# Patient Record
Sex: Female | Born: 1960 | Race: White | Hispanic: No | Smoking: Never smoker
Health system: Southern US, Community
[De-identification: ages and names within clinical notes are randomized; demographics above are authoritative.]

## PROBLEM LIST (undated history)

## (undated) DIAGNOSIS — T8859XA Other complications of anesthesia, initial encounter: Secondary | ICD-10-CM

## (undated) DIAGNOSIS — M19011 Primary osteoarthritis, right shoulder: Secondary | ICD-10-CM

## (undated) DIAGNOSIS — G4730 Sleep apnea, unspecified: Secondary | ICD-10-CM

## (undated) DIAGNOSIS — Z8619 Personal history of other infectious and parasitic diseases: Secondary | ICD-10-CM

## (undated) DIAGNOSIS — I10 Essential (primary) hypertension: Secondary | ICD-10-CM

## (undated) DIAGNOSIS — T4145XA Adverse effect of unspecified anesthetic, initial encounter: Secondary | ICD-10-CM

## (undated) DIAGNOSIS — M1990 Unspecified osteoarthritis, unspecified site: Secondary | ICD-10-CM

## (undated) DIAGNOSIS — I5030 Unspecified diastolic (congestive) heart failure: Secondary | ICD-10-CM

## (undated) DIAGNOSIS — R7303 Prediabetes: Secondary | ICD-10-CM

## (undated) DIAGNOSIS — D649 Anemia, unspecified: Secondary | ICD-10-CM

## (undated) DIAGNOSIS — K219 Gastro-esophageal reflux disease without esophagitis: Secondary | ICD-10-CM

## (undated) HISTORY — PX: APPENDECTOMY: SHX54

## (undated) HISTORY — PX: SINUS IRRIGATION: SHX2411

---

## 1997-03-03 DIAGNOSIS — G4730 Sleep apnea, unspecified: Secondary | ICD-10-CM

## 1997-03-03 HISTORY — DX: Sleep apnea, unspecified: G47.30

## 2011-10-02 ENCOUNTER — Emergency Department: Payer: Self-pay | Admitting: Emergency Medicine

## 2011-10-02 LAB — URINALYSIS, COMPLETE
Bilirubin,UR: NEGATIVE
Ketone: NEGATIVE
Leukocyte Esterase: NEGATIVE
RBC,UR: 4113 /HPF (ref 0–5)
Specific Gravity: 1.026 (ref 1.003–1.030)
Squamous Epithelial: NONE SEEN
WBC UR: NONE SEEN /HPF (ref 0–5)

## 2011-10-02 LAB — BASIC METABOLIC PANEL
Anion Gap: 8 (ref 7–16)
BUN: 18 mg/dL (ref 7–18)
Co2: 25 mmol/L (ref 21–32)
Creatinine: 0.82 mg/dL (ref 0.60–1.30)
EGFR (Non-African Amer.): >60
Glucose: 96 mg/dL (ref 65–99)
Osmolality: 283 (ref 275–301)
Potassium: 4.7 mmol/L (ref 3.5–5.1)
Sodium: 141 mmol/L (ref 136–145)

## 2011-10-02 LAB — CBC
HCT: 29 % — ABNORMAL LOW (ref 35.0–47.0)
MCV: 76 fL — ABNORMAL LOW (ref 80–100)
RBC: 3.83 10*6/uL (ref 3.80–5.20)
RDW: 19.3 % — ABNORMAL HIGH (ref 11.5–14.5)
WBC: 6.7 10*3/uL (ref 3.6–11.0)

## 2011-10-02 LAB — PREGNANCY, URINE: Pregnancy Test, Urine: NEGATIVE m[IU]/mL

## 2013-05-06 ENCOUNTER — Emergency Department: Payer: Self-pay | Admitting: Emergency Medicine

## 2013-05-06 LAB — COMPREHENSIVE METABOLIC PANEL
Albumin: 3.3 g/dL — ABNORMAL LOW (ref 3.4–5.0)
Alkaline Phosphatase: 91 U/L
Anion Gap: 6 — ABNORMAL LOW (ref 7–16)
BILIRUBIN TOTAL: 0.4 mg/dL (ref 0.2–1.0)
BUN: 10 mg/dL (ref 7–18)
CHLORIDE: 104 mmol/L (ref 98–107)
Calcium, Total: 8.7 mg/dL (ref 8.5–10.1)
Co2: 26 mmol/L (ref 21–32)
Creatinine: 0.73 mg/dL (ref 0.60–1.30)
EGFR (African American): >60
EGFR (Non-African Amer.): >60
GLUCOSE: 105 mg/dL — AB (ref 65–99)
OSMOLALITY: 271 (ref 275–301)
POTASSIUM: 3.5 mmol/L (ref 3.5–5.1)
SGOT(AST): 31 U/L (ref 15–37)
SGPT (ALT): 32 U/L (ref 12–78)
Sodium: 136 mmol/L (ref 136–145)
Total Protein: 7.1 g/dL (ref 6.4–8.2)

## 2013-05-06 LAB — CBC WITH DIFFERENTIAL/PLATELET
BASOS PCT: 0.6 %
Basophil #: 0.1 10*3/uL (ref 0.0–0.1)
EOS PCT: 2.2 %
Eosinophil #: 0.2 10*3/uL (ref 0.0–0.7)
HCT: 32.5 % — ABNORMAL LOW (ref 35.0–47.0)
HGB: 10.4 g/dL — AB (ref 12.0–16.0)
LYMPHS PCT: 20.8 %
Lymphocyte #: 1.6 10*3/uL (ref 1.0–3.6)
MCH: 26.1 pg (ref 26.0–34.0)
MCHC: 32.1 g/dL (ref 32.0–36.0)
MCV: 81 fL (ref 80–100)
MONOS PCT: 6.2 %
Monocyte #: 0.5 x10 3/mm (ref 0.2–0.9)
NEUTROS ABS: 5.5 10*3/uL (ref 1.4–6.5)
Neutrophil %: 70.2 %
PLATELETS: 278 10*3/uL (ref 150–440)
RBC: 3.99 10*6/uL (ref 3.80–5.20)
RDW: 17.1 % — ABNORMAL HIGH (ref 11.5–14.5)
WBC: 7.9 10*3/uL (ref 3.6–11.0)

## 2013-05-06 LAB — URINALYSIS, COMPLETE
BILIRUBIN, UR: NEGATIVE
Bacteria: NONE SEEN
Blood: NEGATIVE
GLUCOSE, UR: NEGATIVE mg/dL (ref 0–75)
Ketone: NEGATIVE
Nitrite: NEGATIVE
Ph: 8 (ref 4.5–8.0)
Protein: NEGATIVE
Specific Gravity: 1.011 (ref 1.003–1.030)
Squamous Epithelial: 4

## 2013-05-06 LAB — LIPASE, BLOOD: Lipase: 86 U/L (ref 73–393)

## 2013-12-12 ENCOUNTER — Emergency Department: Payer: Self-pay | Admitting: Emergency Medicine

## 2014-08-28 ENCOUNTER — Other Ambulatory Visit: Payer: Self-pay | Admitting: Orthopedic Surgery

## 2014-08-28 DIAGNOSIS — M25562 Pain in left knee: Secondary | ICD-10-CM

## 2014-09-06 ENCOUNTER — Ambulatory Visit
Admission: RE | Admit: 2014-09-06 | Discharge: 2014-09-06 | Disposition: A | Discharge status: Home / Self Care | Payer: No Typology Code available for payment source | Source: Ambulatory Visit | Attending: Orthopedic Surgery | Admitting: Orthopedic Surgery

## 2014-09-06 DIAGNOSIS — M25562 Pain in left knee: Secondary | ICD-10-CM | POA: Insufficient documentation

## 2015-01-31 ENCOUNTER — Emergency Department: Payer: No Typology Code available for payment source

## 2015-01-31 ENCOUNTER — Emergency Department
Admission: EM | Admit: 2015-01-31 | Discharge: 2015-01-31 | Disposition: A | Discharge status: Home / Self Care | Payer: No Typology Code available for payment source | Attending: Emergency Medicine | Admitting: Emergency Medicine

## 2015-01-31 DIAGNOSIS — R0789 Other chest pain: Secondary | ICD-10-CM

## 2015-01-31 DIAGNOSIS — S20211A Contusion of right front wall of thorax, initial encounter: Secondary | ICD-10-CM | POA: Insufficient documentation

## 2015-01-31 DIAGNOSIS — S29001A Unspecified injury of muscle and tendon of front wall of thorax, initial encounter: Secondary | ICD-10-CM | POA: Diagnosis present

## 2015-01-31 DIAGNOSIS — I10 Essential (primary) hypertension: Secondary | ICD-10-CM | POA: Diagnosis not present

## 2015-01-31 DIAGNOSIS — Y9389 Activity, other specified: Secondary | ICD-10-CM | POA: Diagnosis not present

## 2015-01-31 DIAGNOSIS — S199XXA Unspecified injury of neck, initial encounter: Secondary | ICD-10-CM | POA: Insufficient documentation

## 2015-01-31 DIAGNOSIS — Y998 Other external cause status: Secondary | ICD-10-CM | POA: Diagnosis not present

## 2015-01-31 DIAGNOSIS — S39012A Strain of muscle, fascia and tendon of lower back, initial encounter: Secondary | ICD-10-CM | POA: Insufficient documentation

## 2015-01-31 DIAGNOSIS — Y92410 Unspecified street and highway as the place of occurrence of the external cause: Secondary | ICD-10-CM | POA: Insufficient documentation

## 2015-01-31 HISTORY — DX: Essential (primary) hypertension: I10

## 2015-01-31 MED ORDER — KETOROLAC TROMETHAMINE 30 MG/ML IJ SOLN
60.0000 mg | Freq: Once | INTRAMUSCULAR | Status: AC
  Administered 2015-01-31: 60 mg via INTRAMUSCULAR

## 2015-01-31 MED ORDER — TRAMADOL HCL 50 MG PO TABS: 50.0000 mg | ORAL_TABLET | Freq: Four times a day (QID) | ORAL | Status: DC | PRN

## 2015-01-31 NOTE — Discharge Instructions (Signed)
Chest Wall Pain Chest wall pain is pain in or around the bones and muscles of your chest. Sometimes, an injury causes this pain. Sometimes, the cause may not be known. This pain may take several weeks or longer to get better. HOME CARE INSTRUCTIONS  Pay attention to any changes in your symptoms. Take these actions to help with your pain:   Rest as told by your health care provider.   Avoid activities that cause pain. These include any activities that use your chest muscles or your abdominal and side muscles to lift heavy items.   If directed, apply ice to the painful area:  Put ice in a plastic bag.  Place a towel between your skin and the bag.  Leave the ice on for 20 minutes, 2-3 times per day.  Take over-the-counter and prescription medicines only as told by your health care provider.  Do not use tobacco products, including cigarettes, chewing tobacco, and e-cigarettes. If you need help quitting, ask your health care provider.  Keep all follow-up visits as told by your health care provider. This is important. SEEK MEDICAL CARE IF:  You have a fever.  Your chest pain becomes worse.  You have new symptoms. SEEK IMMEDIATE MEDICAL CARE IF:  You have nausea or vomiting.  You feel sweaty or light-headed.  You have a cough with phlegm (sputum) or you cough up blood.  You develop shortness of breath.   This information is not intended to replace advice given to you by your health care provider. Make sure you discuss any questions you have with your health care provider.   Document Released: 02/17/2005 Document Revised: 11/08/2014 Document Reviewed: 05/15/2014 Elsevier Interactive Patient Education [employer]2016 [employer].  Please return immediately if condition worsens. Please contact her primary physician or the physician you were given for referral. If you have any specialist physicians involved in her treatment and plan please also contact them. Thank you for using [place]  [facility]. Return especially for increasing chest, abdominal pain, or any other new concerns
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
## 2015-04-26 ENCOUNTER — Other Ambulatory Visit: Payer: Self-pay | Admitting: Family Medicine

## 2015-04-26 DIAGNOSIS — M25511 Pain in right shoulder: Secondary | ICD-10-CM

## 2015-05-17 ENCOUNTER — Ambulatory Visit
Admission: RE | Admit: 2015-05-17 | Discharge: 2015-05-17 | Disposition: A | Discharge status: Home / Self Care | Payer: BLUE CROSS/BLUE SHIELD | Source: Ambulatory Visit | Attending: Family Medicine | Admitting: Family Medicine

## 2015-05-17 DIAGNOSIS — M67813 Other specified disorders of tendon, right shoulder: Secondary | ICD-10-CM | POA: Diagnosis not present

## 2015-05-17 DIAGNOSIS — M75101 Unspecified rotator cuff tear or rupture of right shoulder, not specified as traumatic: Secondary | ICD-10-CM | POA: Insufficient documentation

## 2015-05-17 DIAGNOSIS — T1490 Injury, unspecified: Secondary | ICD-10-CM | POA: Diagnosis present

## 2015-05-17 DIAGNOSIS — M25511 Pain in right shoulder: Secondary | ICD-10-CM | POA: Diagnosis present

## 2015-05-30 ENCOUNTER — Other Ambulatory Visit: Payer: BLUE CROSS/BLUE SHIELD

## 2015-05-30 ENCOUNTER — Encounter: Payer: Self-pay | Admitting: *Deleted

## 2015-05-30 NOTE — Patient Instructions (Signed)
  Your procedure is scheduled on: 06-05-15 Report to [facility] 2ND FLOOR. To find out your arrival time please call [phone] between 1PM - 3PM on 06-04-15  Remember: Instructions that are not followed completely may result in serious medical risk, up to and including death, or upon the discretion of your surgeon and anesthesiologist your surgery may need to be rescheduled.    _X___ 1. Do not eat food or drink liquids after midnight. No gum chewing or hard candies.     _X___ 2. No Alcohol for 24 hours before or after surgery.   ____ 3. Bring all medications with you on the day of surgery if instructed.    _X___ 4. Notify your doctor if there is any change in your medical condition     (cold, fever, infections).     Do not wear jewelry, make-up, hairpins, clips or nail polish.  Do not wear lotions, powders, or perfumes. You may wear deodorant.  Do not shave 48 hours prior to surgery. Men may shave face and neck.  Do not bring valuables to the hospital.    [facility] belongings or valuables.               Contacts, dentures or bridgework may not be worn into surgery.  Leave your suitcase in the car. After surgery it may be brought to your room.  For patients admitted to the hospital, discharge time is determined by your  treatment team.   Patients discharged the day of surgery will not be allowed to drive home.   Please read over the following fact sheets that you were given:    _X___ Take these medicines the morning of surgery with A SIP OF WATER:    1. PRILOSEC  2.  TAKE A PRILOSEC Monday NIGHT(06-04-15) BEFORE BED  3.   4.  5.  6.  ____ Fleet Enema (as directed)   ____ Use CHG Soap as directed  ____ Use inhalers on the day of surgery  ____ Stop metformin 2 days prior to surgery    ____ Take 1/2 of usual insulin dose the night before surgery and none on the morning of surgery.   ____ Stop  Coumadin/Plavix/aspirin-N/A  ____ Stop Anti-inflammatories-NO NSAIDS OR ASA PRODUCTS-TYLENOL OK TO TAKE   ____ Stop supplements until after surgery.    ____ Bring C-Pap to the hospital.

## 2015-06-04 ENCOUNTER — Encounter: Payer: Self-pay | Admitting: *Deleted

## 2015-06-05 ENCOUNTER — Ambulatory Visit: Payer: BLUE CROSS/BLUE SHIELD | Admitting: Certified Registered"

## 2015-06-05 ENCOUNTER — Ambulatory Visit
Admission: RE | Admit: 2015-06-05 | Discharge: 2015-06-05 | Disposition: A | Discharge status: Home / Self Care | Payer: BLUE CROSS/BLUE SHIELD | Source: Ambulatory Visit | Attending: Surgery | Admitting: Surgery

## 2015-06-05 ENCOUNTER — Encounter: Payer: Self-pay | Admitting: *Deleted

## 2015-06-05 ENCOUNTER — Encounter
Admission: RE | Disposition: A | Discharge status: Home / Self Care | Payer: Self-pay | Source: Ambulatory Visit | Attending: Surgery

## 2015-06-05 DIAGNOSIS — Z791 Long term (current) use of non-steroidal anti-inflammatories (NSAID): Secondary | ICD-10-CM | POA: Diagnosis not present

## 2015-06-05 DIAGNOSIS — Z8249 Family history of ischemic heart disease and other diseases of the circulatory system: Secondary | ICD-10-CM | POA: Diagnosis not present

## 2015-06-05 DIAGNOSIS — X58XXXA Exposure to other specified factors, initial encounter: Secondary | ICD-10-CM | POA: Diagnosis not present

## 2015-06-05 DIAGNOSIS — Z79899 Other long term (current) drug therapy: Secondary | ICD-10-CM | POA: Diagnosis not present

## 2015-06-05 DIAGNOSIS — S43431A Superior glenoid labrum lesion of right shoulder, initial encounter: Secondary | ICD-10-CM | POA: Diagnosis not present

## 2015-06-05 DIAGNOSIS — Z9049 Acquired absence of other specified parts of digestive tract: Secondary | ICD-10-CM | POA: Diagnosis not present

## 2015-06-05 DIAGNOSIS — M75111 Incomplete rotator cuff tear or rupture of right shoulder, not specified as traumatic: Secondary | ICD-10-CM | POA: Insufficient documentation

## 2015-06-05 DIAGNOSIS — K219 Gastro-esophageal reflux disease without esophagitis: Secondary | ICD-10-CM | POA: Insufficient documentation

## 2015-06-05 DIAGNOSIS — Z808 Family history of malignant neoplasm of other organs or systems: Secondary | ICD-10-CM | POA: Diagnosis not present

## 2015-06-05 DIAGNOSIS — I10 Essential (primary) hypertension: Secondary | ICD-10-CM | POA: Insufficient documentation

## 2015-06-05 DIAGNOSIS — M19011 Primary osteoarthritis, right shoulder: Secondary | ICD-10-CM | POA: Diagnosis not present

## 2015-06-05 HISTORY — DX: Unspecified osteoarthritis, unspecified site: M19.90

## 2015-06-05 HISTORY — PX: SHOULDER ARTHROSCOPY: SHX128

## 2015-06-05 HISTORY — DX: Gastro-esophageal reflux disease without esophagitis: K21.9

## 2015-06-05 SURGERY — ARTHROSCOPY, SHOULDER
Anesthesia: General | Laterality: Right | Wound class: Clean

## 2015-06-05 MED ORDER — NEOSTIGMINE METHYLSULFATE 10 MG/10ML IV SOLN
INTRAVENOUS | Status: DC | PRN
  Administered 2015-06-05: 5 mg via INTRAVENOUS

## 2015-06-05 MED ORDER — FENTANYL CITRATE (PF) 100 MCG/2ML IJ SOLN
INTRAMUSCULAR | Status: DC | PRN
  Administered 2015-06-05 (×3): 50 ug via INTRAVENOUS
  Administered 2015-06-05 (×2): 25 ug via INTRAVENOUS

## 2015-06-05 MED ORDER — ROPIVACAINE HCL 2 MG/ML IJ SOLN
INTRAMUSCULAR | Status: AC
  Filled 2015-06-05: qty 20

## 2015-06-05 MED ORDER — FENTANYL CITRATE (PF) 100 MCG/2ML IJ SOLN
INTRAMUSCULAR | Status: AC
  Administered 2015-06-05: 25 ug via INTRAVENOUS
  Filled 2015-06-05: qty 2

## 2015-06-05 MED ORDER — MIDAZOLAM HCL 5 MG/5ML IJ SOLN
INTRAMUSCULAR | Status: AC
  Administered 2015-06-05: 1 mg
  Filled 2015-06-05: qty 5

## 2015-06-05 MED ORDER — DEXAMETHASONE SODIUM PHOSPHATE 10 MG/ML IJ SOLN
INTRAMUSCULAR | Status: DC | PRN
  Administered 2015-06-05: 4 mg via INTRAVENOUS

## 2015-06-05 MED ORDER — PROPOFOL 10 MG/ML IV BOLUS
INTRAVENOUS | Status: DC | PRN
  Administered 2015-06-05: 150 mg via INTRAVENOUS

## 2015-06-05 MED ORDER — LACTATED RINGERS IV SOLN
INTRAVENOUS | Status: DC
  Administered 2015-06-05 (×2): via INTRAVENOUS

## 2015-06-05 MED ORDER — FENTANYL CITRATE (PF) 100 MCG/2ML IJ SOLN: 25.0000 ug | INTRAMUSCULAR | Status: DC | PRN

## 2015-06-05 MED ORDER — EPINEPHRINE HCL 1 MG/ML IJ SOLN
INTRAMUSCULAR | Status: AC
  Filled 2015-06-05: qty 2

## 2015-06-05 MED ORDER — OXYCODONE HCL 5 MG PO TABS: 5.0000 mg | ORAL_TABLET | ORAL | Status: DC | PRN

## 2015-06-05 MED ORDER — CEFAZOLIN SODIUM-DEXTROSE 2-4 GM/100ML-% IV SOLN
INTRAVENOUS | Status: AC
  Filled 2015-06-05: qty 100

## 2015-06-05 MED ORDER — EPINEPHRINE HCL 1 MG/ML IJ SOLN
INTRAMUSCULAR | Status: AC
  Filled 2015-06-05: qty 1

## 2015-06-05 MED ORDER — ONDANSETRON HCL 4 MG/2ML IJ SOLN: 4.0000 mg | Freq: Once | INTRAMUSCULAR | Status: DC | PRN

## 2015-06-05 MED ORDER — MIDAZOLAM HCL 2 MG/2ML IJ SOLN
INTRAMUSCULAR | Status: DC | PRN
  Administered 2015-06-05 (×2): 2 mg via INTRAVENOUS

## 2015-06-05 MED ORDER — FENTANYL CITRATE (PF) 100 MCG/2ML IJ SOLN
50.0000 ug | Freq: Once | INTRAMUSCULAR | Status: AC
  Administered 2015-06-05: 50 ug via INTRAVENOUS

## 2015-06-05 MED ORDER — ROCURONIUM BROMIDE 100 MG/10ML IV SOLN
INTRAVENOUS | Status: DC | PRN
  Administered 2015-06-05: 30 mg via INTRAVENOUS
  Administered 2015-06-05 (×3): 5 mg via INTRAVENOUS

## 2015-06-05 MED ORDER — FENTANYL CITRATE (PF) 100 MCG/2ML IJ SOLN
25.0000 ug | INTRAMUSCULAR | Status: AC | PRN
  Administered 2015-06-05 (×6): 25 ug via INTRAVENOUS

## 2015-06-05 MED ORDER — MIDAZOLAM HCL 5 MG/5ML IJ SOLN
1.0000 mg | Freq: Once | INTRAMUSCULAR | Status: AC
  Administered 2015-06-05: 1 mg via INTRAVENOUS

## 2015-06-05 MED ORDER — EPINEPHRINE HCL 1 MG/ML IJ SOLN
INTRAMUSCULAR | Status: DC
Start: 2015-06-05 — End: 2015-06-05
  Filled 2015-06-05: qty 1

## 2015-06-05 MED ORDER — CEFAZOLIN SODIUM-DEXTROSE 2-4 GM/100ML-% IV SOLN
2.0000 g | Freq: Once | INTRAVENOUS | Status: AC
  Administered 2015-06-05: 2 g via INTRAVENOUS

## 2015-06-05 MED ORDER — FENTANYL CITRATE (PF) 100 MCG/2ML IJ SOLN
INTRAMUSCULAR | Status: AC
  Filled 2015-06-05: qty 2

## 2015-06-05 MED ORDER — ONDANSETRON HCL 4 MG/2ML IJ SOLN
INTRAMUSCULAR | Status: DC | PRN
  Administered 2015-06-05: 4 mg via INTRAVENOUS

## 2015-06-05 MED ORDER — BUPIVACAINE-EPINEPHRINE (PF) 0.5% -1:200000 IJ SOLN
INTRAMUSCULAR | Status: AC
  Filled 2015-06-05: qty 30

## 2015-06-05 MED ORDER — BUPIVACAINE-EPINEPHRINE (PF) 0.5% -1:200000 IJ SOLN
INTRAMUSCULAR | Status: DC | PRN
  Administered 2015-06-05: 30 mL

## 2015-06-05 MED ORDER — FAMOTIDINE 20 MG PO TABS
ORAL_TABLET | ORAL | Status: AC
  Filled 2015-06-05: qty 1

## 2015-06-05 MED ORDER — LIDOCAINE HCL (CARDIAC) 20 MG/ML IV SOLN
INTRAVENOUS | Status: DC | PRN
  Administered 2015-06-05: 40 mg via INTRAVENOUS

## 2015-06-05 MED ORDER — LIDOCAINE HCL (PF) 1 % IJ SOLN
INTRAMUSCULAR | Status: AC
  Filled 2015-06-05: qty 5

## 2015-06-05 MED ORDER — GLYCOPYRROLATE 0.2 MG/ML IJ SOLN
INTRAMUSCULAR | Status: DC | PRN
  Administered 2015-06-05: .8 mg via INTRAVENOUS

## 2015-06-05 MED ORDER — EPINEPHRINE HCL 1 MG/ML IJ SOLN
INTRAMUSCULAR | Status: DC | PRN
  Administered 2015-06-05: 3 mL

## 2015-06-05 MED ORDER — SUCCINYLCHOLINE CHLORIDE 20 MG/ML IJ SOLN
INTRAMUSCULAR | Status: DC | PRN
  Administered 2015-06-05: 140 mg via INTRAVENOUS

## 2015-06-05 MED ORDER — FENTANYL CITRATE (PF) 100 MCG/2ML IJ SOLN
INTRAMUSCULAR | Status: AC
  Administered 2015-06-05: 50 ug
  Filled 2015-06-05: qty 2

## 2015-06-05 SURGICAL SUPPLY — 58 items
ANCHOR JUGGERKNOT WTAP NDL 2.9 (Anchor) ×2 IMPLANT
ANCHOR SUT QUATTRO KNTLS 4.5 (Anchor) ×2 IMPLANT
ANCHOR SUT W/ ORTHOCORD (Anchor) ×2 IMPLANT
BIT DRILL JUGRKNT W/NDL BIT2.9 (DRILL) ×1 IMPLANT
BLADE FULL RADIUS 3.5 (BLADE) IMPLANT
BLADE SHAVER 4.5X7 STR FR (MISCELLANEOUS) ×2 IMPLANT
BUR ACROMIONIZER 4.0 (BURR) ×2 IMPLANT
BUR BR 5.5 WIDE MOUTH (BURR) IMPLANT
CANNULA 8.5X75 THRED (CANNULA) ×2 IMPLANT
CANNULA SHAVER 8MMX76MM (CANNULA) ×4 IMPLANT
CANNULA TWIST IN 8.25X9CM (CANNULA) ×2 IMPLANT
CHLORAPREP W/TINT 26ML (MISCELLANEOUS) ×4 IMPLANT
DRAPE IMP U-DRAPE 54X76 (DRAPES) ×4 IMPLANT
DRAPE SURG 17X11 SM STRL (DRAPES) ×2 IMPLANT
DRILL JUGGERKNOT W/NDL BIT 2.9 (DRILL) ×2
DRSG OPSITE POSTOP 4X8 (GAUZE/BANDAGES/DRESSINGS) ×2 IMPLANT
ELECT REM PT RETURN 9FT ADLT (ELECTROSURGICAL) ×2
ELECTRODE REM PT RTRN 9FT ADLT (ELECTROSURGICAL) ×1 IMPLANT
GAUZE PETRO XEROFOAM 1X8 (MISCELLANEOUS) ×2 IMPLANT
GAUZE SPONGE 4X4 12PLY STRL (GAUZE/BANDAGES/DRESSINGS) ×2 IMPLANT
GLOVE BIO SURGEON STRL SZ7.5 (GLOVE) ×12 IMPLANT
GLOVE BIO SURGEON STRL SZ8 (GLOVE) ×4 IMPLANT
GLOVE BIOGEL PI IND STRL 8 (GLOVE) ×6 IMPLANT
GLOVE BIOGEL PI INDICATOR 8 (GLOVE) ×6
GLOVE INDICATOR 8.0 STRL GRN (GLOVE) ×2 IMPLANT
GOWN STRL REUS W/ TWL LRG LVL3 (GOWN DISPOSABLE) ×2 IMPLANT
GOWN STRL REUS W/ TWL XL LVL3 (GOWN DISPOSABLE) ×1 IMPLANT
GOWN STRL REUS W/TWL LRG LVL3 (GOWN DISPOSABLE) ×2
GOWN STRL REUS W/TWL XL LVL3 (GOWN DISPOSABLE) ×1
GRASPER SUT 15 45D LOW PRO (SUTURE) ×2 IMPLANT
IV LACTATED RINGER IRRG 3000ML (IV SOLUTION) ×3
IV LR IRRIG 3000ML ARTHROMATIC (IV SOLUTION) ×3 IMPLANT
MANIFOLD NEPTUNE II (INSTRUMENTS) ×2 IMPLANT
MASK FACE SPIDER DISP (MASK) ×2 IMPLANT
MAT BLUE FLOOR 46X72 FLO (MISCELLANEOUS) ×2 IMPLANT
NDL MAYO CATGUT SZ4 (NEEDLE) ×2 IMPLANT
NEEDLE HYPO 22GX1.5 SAFETY (NEEDLE) ×2 IMPLANT
NEEDLE MAYO 6 CRC TAPER PT (NEEDLE) ×2 IMPLANT
NEEDLE MAYO CATGUT SZ 1.5 (NEEDLE) ×2
NEEDLE MAYO CATGUT SZ 2 (NEEDLE) ×1 IMPLANT
NEEDLE REVERSE CUT 1/2 CRC (NEEDLE) ×2 IMPLANT
NEEDLE SPNL 18GX3.5 QUINCKE PK (NEEDLE) ×2 IMPLANT
PACK ARTHROSCOPY SHOULDER (MISCELLANEOUS) ×2 IMPLANT
SLING ARM LRG DEEP (SOFTGOODS) IMPLANT
SLING ULTRA II LG (MISCELLANEOUS) ×2 IMPLANT
STAPLER SKIN PROX 35W (STAPLE) ×2 IMPLANT
STRAP SAFETY BODY (MISCELLANEOUS) ×2 IMPLANT
SUT ETHIBOND 0 MO6 C/R (SUTURE) ×2 IMPLANT
SUT PROLENE 4 0 PS 2 18 (SUTURE) ×2 IMPLANT
SUT VIC AB 0 CT1 36 (SUTURE) ×2 IMPLANT
SUT VIC AB 0 CT2 27 (SUTURE) ×2 IMPLANT
SUT VIC AB 2-0 CT1 27 (SUTURE) ×2
SUT VIC AB 2-0 CT1 TAPERPNT 27 (SUTURE) ×2 IMPLANT
SUT VIC AB 2-0 CT2 27 (SUTURE) ×4 IMPLANT
TAPE MICROFOAM 4IN (TAPE) ×2 IMPLANT
TUBING ARTHRO INFLOW-ONLY STRL (TUBING) ×2 IMPLANT
TUBING CONNECTING 10 (TUBING) ×2 IMPLANT
WAND HAND CNTRL MULTIVAC 90 (MISCELLANEOUS) ×2 IMPLANT

## 2015-06-05 NOTE — Anesthesia Preprocedure Evaluation (Signed)
Anesthesia Evaluation  Patient identified by MRN, date of birth, ID band Patient awake    Reviewed: Allergy & Precautions, H&P , NPO status , Patient's Chart, lab work & pertinent test results, reviewed documented beta blocker date and time   Airway Mallampati: III  TM Distance: >3 FB Neck ROM: full    Dental  (+) Teeth Intact   Pulmonary neg pulmonary ROS,    Pulmonary exam normal        Cardiovascular Exercise Tolerance: Good hypertension, negative cardio ROS Normal cardiovascular exam Rhythm:regular Rate:Normal     Neuro/Psych negative neurological ROS  negative psych ROS   GI/Hepatic negative GI ROS, Neg liver ROS, GERD  ,  Endo/Other  negative endocrine ROS  Renal/GU negative Renal ROS  negative genitourinary   Musculoskeletal   Abdominal   Peds  Hematology negative hematology ROS (+)   Anesthesia Other Findings Past Medical History:   GERD (gastroesophageal reflux disease)                       Arthritis                                                    Hypertension                                                   Comment:H/O OVER A SHORT PERIOD OF TIME- LOST WEIGHT               AND IS NO LONGER TAKING BP MED PER PCP Past Surgical History:   APPENDECTOMY                                                  SINUS IRRIGATION                                            BMI    Body Mass Index   42.52 kg/m 2     Reproductive/Obstetrics negative OB ROS                             Anesthesia Physical Anesthesia Plan  ASA: II  Anesthesia Plan: General ETT   Post-op Pain Management:    Induction:   Airway Management Planned:   Additional Equipment:   Intra-op Plan:   Post-operative Plan:   Informed Consent: I have reviewed the patients History and Physical, chart, labs and discussed the procedure including the risks, benefits and alternatives for the proposed anesthesia  with the patient or authorized representative who has indicated his/her understanding and acceptance.   Dental Advisory Given  Plan Discussed with: CRNA  Anesthesia Plan Comments:         Anesthesia Quick Evaluation

## 2015-06-05 NOTE — Anesthesia Postprocedure Evaluation (Signed)
Anesthesia Post Note  Patient: Sydney MccoyJeannie M Butzin  Procedure(s) Performed: Procedure(s) (LRB): ARTHROSCOPY SHOULDER (Right)  Patient location during evaluation: PACU Anesthesia Type: General Level of consciousness: awake and alert Pain management: pain level controlled Vital Signs Assessment: post-procedure vital signs reviewed and stable Respiratory status: spontaneous breathing, nonlabored ventilation, respiratory function stable and patient connected to nasal cannula oxygen Cardiovascular status: blood pressure returned to baseline and stable Postop Assessment: no signs of nausea or vomiting Anesthetic complications: no    Last Vitals:  Filed Vitals:   06/05/15 1155 06/05/15 1210  BP: 158/69 157/86  Pulse: 71 67  Temp: 36.2 C   Resp: 13 18    Last Pain:  Filed Vitals:   06/05/15 1212  PainSc: 8                  Yevette EdwardsJames G Adams

## 2015-06-05 NOTE — Discharge Instructions (Addendum)
Keep dressing dry and intact.  May shower after dressing changed on post-op day #4 (Saturday).  Cover staples with Band-Aids after drying off. Apply ice frequently to shoulder. Take ibuprofen 800 mg three times daily with meals for 10-14 days, then as needed. Take pain medication as ordered when needed. May supplement these medications with ES Tylenol if necessary. Keep shoulder immobilizer on at all times except may remove for bathing purposes. Follow-up in 10-14 days or as scheduled.  AMBULATORY SURGERY  DISCHARGE INSTRUCTIONS   1) The drugs that you were given will stay in your system until tomorrow so for the next 24 hours you should not:  A) Drive an automobile B) Make any legal decisions C) Drink any alcoholic beverage   2) You may resume regular meals tomorrow.  Today it is better to start with liquids and gradually work up to solid foods.  You may eat anything you prefer, but it is better to start with liquids, then soup and crackers, and gradually work up to solid foods.   3) Please notify your doctor immediately if you have any unusual bleeding, trouble breathing, redness and pain at the surgery site, drainage, fever, or pain not relieved by medication.    4) Additional Instructions:        Please contact your physician with any problems or Same Day Surgery at [phone], Monday through Friday 6 am to 4 pm, or [place] [facility] Main number at [phone].
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
## 2015-06-05 NOTE — Anesthesia Procedure Notes (Addendum)
Anesthesia Regional Block:  Interscalene brachial plexus block  Pre-Anesthetic Checklist: ,, timeout performed, Correct Patient, Correct Site, Correct Laterality, Correct Procedure, Correct Position, site marked, Risks and benefits discussed,  Surgical consent,  Pre-op evaluation,  At surgeon's request and post-op pain management  Laterality: Left  Prep: chloraprep       Needles:  Injection technique: Single-shot  Needle Type: Echogenic Stimulator Needle     Needle Length: 5cm 5 cm Needle Gauge: 21 and 21 G    Additional Needles:  Procedures: Doppler guided, ultrasound guided (picture in chart) and nerve stimulator Interscalene brachial plexus block  Nerve Stimulator or Paresthesia:  Response: 50 mA, 3 cm  Additional Responses:   Narrative:  Injection made incrementally with aspirations every 5 mL.  Performed by: Personally  Anesthesiologist: ADAMS, Currie ParisJAMES G  Additional Notes: After informed consent was obtained, the patient chose to proceed with an isnb for POP as requested by the surgeon.  IV sedation was obtained and the procedure was completed without difficulty.  Neg IVTD and no pain with injection.  The procedure was tolerated well with vsst. JA 2cc versed and 0.5 fentanyl     Procedure Name: Intubation Performed by: Casey BurkittHOANG, Astoria Condon Pre-anesthesia Checklist: Patient identified, Emergency Drugs available, Suction available, Patient being monitored and Timeout performed Patient Re-evaluated:Patient Re-evaluated prior to inductionOxygen Delivery Method: Circle system utilized Preoxygenation: Pre-oxygenation with 100% oxygen Intubation Type: IV induction Ventilation: Mask ventilation without difficulty Laryngoscope Size: Glidescope and 4 Grade View: Grade II Tube type: Oral Tube size: 7.0 mm Number of attempts: 2 Airway Equipment and Method: Stylet Placement Confirmation: ETT inserted through vocal cords under direct vision,  positive ETCO2 and breath sounds checked-  equal and bilateral Secured at: 21 cm Tube secured with: Tape Dental Injury: Teeth and Oropharynx as per pre-operative assessment  Difficulty Due To: Difficult Airway- due to anterior larynx and Difficulty was anticipated Comments: Recommend using #3 glidescope.

## 2015-06-05 NOTE — OR Nursing (Signed)
Possible latex allergy.  MD aware and orders to proceed with Latex Free gloves and canulas.

## 2015-06-05 NOTE — H&P (Signed)
Paper H&P to be scanned into permanent record. H&P reviewed. No changes. 

## 2015-06-05 NOTE — Transfer of Care (Signed)
Immediate Anesthesia Transfer of Care Note  Patient: Sydney MccoyJeannie M Delsanto  Procedure(s) Performed: Procedure(s): ARTHROSCOPY SHOULDER (Right)  Patient Location: PACU  Anesthesia Type:General  Level of Consciousness: awake, alert  and oriented  Airway & Oxygen Therapy: Patient Spontanous Breathing and Patient connected to face mask oxygen  Post-op Assessment: Report given to RN and Post -op Vital signs reviewed and stable  Post vital signs: Reviewed and stable  Last Vitals:  Filed Vitals:   06/05/15 0810 06/05/15 1155  BP:  158/69  Pulse:  71  Temp:    Resp: 14 13    Complications: No apparent anesthesia complications

## 2015-08-15 ENCOUNTER — Other Ambulatory Visit: Payer: Self-pay | Admitting: Surgery

## 2015-08-15 DIAGNOSIS — M75111 Incomplete rotator cuff tear or rupture of right shoulder, not specified as traumatic: Secondary | ICD-10-CM

## 2015-08-15 DIAGNOSIS — M7581 Other shoulder lesions, right shoulder: Secondary | ICD-10-CM

## 2015-09-05 ENCOUNTER — Ambulatory Visit
Admission: RE | Admit: 2015-09-05 | Discharge: 2015-09-05 | Disposition: A | Discharge status: Home / Self Care | Payer: BLUE CROSS/BLUE SHIELD | Source: Ambulatory Visit | Attending: Surgery | Admitting: Surgery

## 2015-09-05 DIAGNOSIS — M7581 Other shoulder lesions, right shoulder: Secondary | ICD-10-CM | POA: Diagnosis present

## 2015-09-05 DIAGNOSIS — M24111 Other articular cartilage disorders, right shoulder: Secondary | ICD-10-CM | POA: Insufficient documentation

## 2015-09-05 DIAGNOSIS — M75111 Incomplete rotator cuff tear or rupture of right shoulder, not specified as traumatic: Secondary | ICD-10-CM | POA: Diagnosis not present

## 2015-09-05 DIAGNOSIS — Z9889 Other specified postprocedural states: Secondary | ICD-10-CM | POA: Diagnosis present

## 2015-11-15 ENCOUNTER — Emergency Department: Payer: No Typology Code available for payment source

## 2015-11-15 ENCOUNTER — Encounter: Payer: Self-pay | Admitting: Emergency Medicine

## 2015-11-15 ENCOUNTER — Emergency Department
Admission: EM | Admit: 2015-11-15 | Discharge: 2015-11-16 | Disposition: A | Discharge status: Home / Self Care | Payer: No Typology Code available for payment source | Attending: Emergency Medicine | Admitting: Emergency Medicine

## 2015-11-15 DIAGNOSIS — Z79899 Other long term (current) drug therapy: Secondary | ICD-10-CM | POA: Diagnosis not present

## 2015-11-15 DIAGNOSIS — R1011 Right upper quadrant pain: Secondary | ICD-10-CM

## 2015-11-15 DIAGNOSIS — R110 Nausea: Secondary | ICD-10-CM | POA: Diagnosis not present

## 2015-11-15 DIAGNOSIS — R1013 Epigastric pain: Secondary | ICD-10-CM | POA: Diagnosis present

## 2015-11-15 DIAGNOSIS — N390 Urinary tract infection, site not specified: Secondary | ICD-10-CM | POA: Diagnosis not present

## 2015-11-15 DIAGNOSIS — I10 Essential (primary) hypertension: Secondary | ICD-10-CM | POA: Insufficient documentation

## 2015-11-15 LAB — URINALYSIS COMPLETE WITH MICROSCOPIC (ARMC ONLY)
BACTERIA UA: NONE SEEN
Bilirubin Urine: NEGATIVE
GLUCOSE, UA: NEGATIVE mg/dL
HGB URINE DIPSTICK: NEGATIVE
Ketones, ur: NEGATIVE mg/dL
Nitrite: NEGATIVE
Protein, ur: NEGATIVE mg/dL
Specific Gravity, Urine: 1.008 (ref 1.005–1.030)
pH: 6 (ref 5.0–8.0)

## 2015-11-15 LAB — COMPREHENSIVE METABOLIC PANEL
ALBUMIN: 3.8 g/dL (ref 3.5–5.0)
ALK PHOS: 94 U/L (ref 38–126)
ALT: 12 U/L — AB (ref 14–54)
AST: 21 U/L (ref 15–41)
Anion gap: 9 (ref 5–15)
BUN: 7 mg/dL (ref 6–20)
CALCIUM: 10.1 mg/dL (ref 8.9–10.3)
CO2: 25 mmol/L (ref 22–32)
CREATININE: 0.78 mg/dL (ref 0.44–1.00)
Chloride: 103 mmol/L (ref 101–111)
GFR calc Af Amer: >60 mL/min (ref >60)
GFR calc non Af Amer: >60 mL/min (ref >60)
GLUCOSE: 121 mg/dL — AB (ref 65–99)
Potassium: 3.8 mmol/L (ref 3.5–5.1)
SODIUM: 137 mmol/L (ref 135–145)
Total Bilirubin: <0.1 mg/dL — ABNORMAL LOW (ref 0.3–1.2)
Total Protein: 7.9 g/dL (ref 6.5–8.1)

## 2015-11-15 LAB — CBC
HEMATOCRIT: 37.6 % (ref 35.0–47.0)
Hemoglobin: 12.4 g/dL (ref 12.0–16.0)
MCH: 28.8 pg (ref 26.0–34.0)
MCHC: 32.9 g/dL (ref 32.0–36.0)
MCV: 87.4 fL (ref 80.0–100.0)
Platelets: 490 10*3/uL — ABNORMAL HIGH (ref 150–440)
RBC: 4.3 MIL/uL (ref 3.80–5.20)
RDW: 15.3 % — ABNORMAL HIGH (ref 11.5–14.5)
WBC: 13.7 10*3/uL — AB (ref 3.6–11.0)

## 2015-11-15 LAB — TROPONIN I: Troponin I: 0.03 ng/mL (ref <0.03)

## 2015-11-15 LAB — LIPASE, BLOOD: LIPASE: 27 U/L (ref 11–51)

## 2015-11-15 MED ORDER — ONDANSETRON HCL 4 MG/2ML IJ SOLN
INTRAMUSCULAR | Status: AC
  Administered 2015-11-15: 4 mg via INTRAVENOUS
  Filled 2015-11-15: qty 2

## 2015-11-15 MED ORDER — MORPHINE SULFATE (PF) 4 MG/ML IV SOLN
4.0000 mg | Freq: Once | INTRAVENOUS | Status: AC
  Administered 2015-11-15: 4 mg via INTRAVENOUS

## 2015-11-15 MED ORDER — ONDANSETRON HCL 4 MG/2ML IJ SOLN
4.0000 mg | Freq: Once | INTRAMUSCULAR | Status: AC
  Administered 2015-11-15: 4 mg via INTRAVENOUS

## 2015-11-15 MED ORDER — MORPHINE SULFATE (PF) 4 MG/ML IV SOLN
INTRAVENOUS | Status: AC
  Administered 2015-11-15: 4 mg via INTRAVENOUS
  Filled 2015-11-15: qty 1

## 2015-11-15 MED ORDER — IOPAMIDOL (ISOVUE-300) INJECTION 61%
100.0000 mL | Freq: Once | INTRAVENOUS | Status: AC | PRN
  Administered 2015-11-15: 100 mL via INTRAVENOUS

## 2015-11-15 MED ORDER — MORPHINE SULFATE (PF) 4 MG/ML IV SOLN
4.0000 mg | Freq: Once | INTRAVENOUS | Status: AC
  Administered 2015-11-15: 4 mg via INTRAVENOUS
  Filled 2015-11-15: qty 1

## 2015-11-15 MED ORDER — IOPAMIDOL (ISOVUE-300) INJECTION 61%
30.0000 mL | Freq: Once | INTRAVENOUS | Status: AC | PRN
  Administered 2015-11-15: 30 mL via ORAL

## 2015-11-15 NOTE — ED Notes (Signed)
md notified of troponin 0.03

## 2015-11-15 NOTE — ED Triage Notes (Signed)
Pt presents to ED via POV. Pt states RUQ abdominal pain that started last night, c/o nausea as well. Pt also states sudden onset R sided chest pain that started earlier today. Pt presents and appears visibly uncomfortable at this time. Pt states last night and today took 8 gas pills thinking the pain was gas.

## 2015-11-15 NOTE — ED Provider Notes (Signed)
[facility] Emergency Department Provider Note  Time seen: 8:33 PM  I have reviewed the triage vital signs and the nursing notes.   HISTORY  Chief Complaint Chest Pain and Abdominal Pain    HPI Sydney Collins is a 55 y.o. female with a past medical history of arthritis, gastric reflux, hypertension, who presents to the emergency department for right upper quadrant abdominal pain. According to the patient's systemic clock last night she has been experiencing severe right upper quadrant abdominal pain. States some nausea but denies vomiting. Denies diarrhea. Denies fever.  Patient states a strong history of cholecystectomy in  the family, but patient still has her gallbladder in. She has been told she has a gallbladder stone in the past, but did not need to be removed per patient. Patient states intermittent right upper quadrant pain for the past several years, but never to this degree or lasting this long. He describes her pain as sharp pain, severe  Past Medical History:  Diagnosis Date  . Arthritis   . GERD (gastroesophageal reflux disease)   . Hypertension    H/O OVER A SHORT PERIOD OF TIME- LOST WEIGHT AND IS NO LONGER TAKING BP MED PER PCP    There are no active problems to display for this patient.   Past Surgical History:  Procedure Laterality Date  . APPENDECTOMY    . SHOULDER ARTHROSCOPY Right 06/05/2015   Procedure: ARTHROSCOPY SHOULDER;  Surgeon: Christena FlakeJohn J Poggi, MD;  Location: [facility] ORS;  Service: Orthopedics;  Laterality: Right;  . SINUS IRRIGATION      Prior to Admission medications   Medication Sig Start Date End Date Taking? Authorizing Provider  omeprazole (PRILOSEC) 40 MG capsule Take 40 mg by mouth as needed.     Historical Provider, MD  oxyCODONE (ROXICODONE) 5 MG immediate release tablet Take 1-2 tablets (5-10 mg total) by mouth every 4 (four) hours as needed for severe pain. 06/05/15   Christena FlakeJohn J Poggi, MD    No Known Allergies  History  reviewed. No pertinent family history.  Social History Social History  Substance Use Topics  . Smoking status: Never Smoker  . Smokeless tobacco: Never Used  . Alcohol use No    Review of Systems Constitutional: Negative for fever. Cardiovascular: Negative for chest pain. Respiratory: Negative for shortness of breath. Gastrointestinal: Right upper quadrant epigastric abdominal pain. Positive for nausea. Negative for vomiting or diarrhea. Genitourinary: Negative for dysuria. Neurological: Negative for headache 10-point ROS otherwise negative.  ____________________________________________   PHYSICAL EXAM:  VITAL SIGNS: ED Triage Vitals  Enc Vitals Group     BP 11/15/15 1834 (!) 132/92     Pulse Rate 11/15/15 1834 (!) 121     Resp 11/15/15 1834 19     Temp 11/15/15 1834 98.3 F (36.8 C)     Temp Source 11/15/15 1834 Oral     SpO2 11/15/15 1834 100 %     Weight 11/15/15 1834 201 lb (91.2 kg)     Height 11/15/15 1834 5\' 3"  (1.6 m)     Head Circumference --      Peak Flow --      Pain Score 11/15/15 1851 9     Pain Loc --      Pain Edu? --      Excl. in GC? --     Constitutional: Alert and oriented. Well appearing and in no distress. Eyes: Normal exam ENT   Head: Normocephalic and atraumatic.   Mouth/Throat: Mucous membranes are  moist. Cardiovascular: Normal rate, regular rhythm. No murmur Respiratory: Normal respiratory effort without tachypnea nor retractions. Breath sounds are clear  Gastrointestinal: Soft and nontender. No distention.  Musculoskeletal: Nontender with normal range of motion in all extremities. Neurologic:  Normal speech and language. No gross focal neurologic deficits  Skin:  Skin is warm, dry and intact.  Psychiatric: Mood and affect are normal.   ____________________________________________    EKG  EKG reviewed and interpreted by myself shows sinus tachycardia 117 bpm, narrow QRS, normal axis, normal intervals, no ST  changes.  ____________________________________________    RADIOLOGY  Chest x-ray is negative  ____________________________________________   INITIAL IMPRESSION / ASSESSMENT AND PLAN / ED COURSE  Pertinent labs & imaging results that were available during my care of the patient were reviewed by me and considered in my medical decision making (see chart for details).  The patient presents to the emergency department with right upper quadrant epigastric pain beginning at 10 PM last night. Patient has moderate right upper quadrant tenderness to palpation, moderate epigastric tenderness, no rebound or guarding. No distention. Abdomen is otherwise benign. Patient denies any chest pain or shortness breath. States some nausea but denies any vomiting. We will treat the patient's pain and nausea, obtain an ultrasound of the gallbladder to further evaluate. Patient's LFTs are largely within normal limits, lipase is pending. Slight leukocytosis of 13,700.  The patient continues with right upper quadrant abdominal pain. Ultrasound is negative. Labs have been largely unrevealing with normal LFTs, normal lipase. We will re-dose pain medication and proceed with a CT abdomen/pelvis will also send a second troponin.  ____________________________________________   FINAL CLINICAL IMPRESSION(S) / ED DIAGNOSES  Right Upper quadrant abdominal pain    Minna Antis, MD 11/18/15 1432

## 2015-11-16 LAB — TROPONIN I

## 2015-11-16 MED ORDER — CEPHALEXIN 500 MG PO CAPS
500.0000 mg | ORAL_CAPSULE | Freq: Once | ORAL | Status: AC
  Administered 2015-11-16: 500 mg via ORAL
  Filled 2015-11-16: qty 1

## 2015-11-16 MED ORDER — HYDROMORPHONE HCL 2 MG PO TABS: 2.0000 mg | ORAL_TABLET | Freq: Four times a day (QID) | ORAL | 0 refills | Status: DC | PRN

## 2015-11-16 MED ORDER — ONDANSETRON 4 MG PO TBDP: 4.0000 mg | ORAL_TABLET | Freq: Three times a day (TID) | ORAL | 0 refills | Status: DC | PRN

## 2015-11-16 MED ORDER — CEPHALEXIN 500 MG PO CAPS: 500.0000 mg | ORAL_CAPSULE | Freq: Three times a day (TID) | ORAL | 0 refills | Status: DC

## 2015-11-16 MED ORDER — HYDROMORPHONE HCL 2 MG PO TABS
2.0000 mg | ORAL_TABLET | Freq: Once | ORAL | Status: AC
  Administered 2015-11-16: 2 mg via ORAL
  Filled 2015-11-16: qty 1

## 2015-11-16 MED ORDER — ONDANSETRON 4 MG PO TBDP
4.0000 mg | ORAL_TABLET | Freq: Once | ORAL | Status: AC
  Administered 2015-11-16: 4 mg via ORAL
  Filled 2015-11-16: qty 1

## 2015-11-16 MED ORDER — HYDROMORPHONE HCL 2 MG PO TABS
ORAL_TABLET | ORAL | Status: AC
  Administered 2015-11-16: 2 mg via ORAL
  Filled 2015-11-16: qty 1

## 2015-11-16 NOTE — ED Notes (Signed)

## 2015-11-16 NOTE — Discharge Instructions (Signed)
1. Take antibiotic as prescribed (Keflex 500 mg 3 times daily 7 days). 2. You may take medicines as needed for pain and nausea (Dilaudid/Zofran). 3. Return to the ER for worsening symptoms, persistent vomiting, difficulty breathing or other concerns.

## 2015-11-16 NOTE — ED Provider Notes (Signed)
-----------------------------------------   12:58 AM on 11/16/2015 -----------------------------------------  CT abdomen/pelvis interpreted per Dr. Chase PicketHerman: 1. Decreased contrast enhancement of the pancreatic head with mild  adjacent fat stranding. The findings may indicate acute  pancreatitis. No peripancreatic fluid collection.  2. No other acute abnormality of the abdomen or pelvis.  3. Unchanged large hiatal hernia without evidence of obstruction.   Repeat troponin is negative. Patient reports great improvement in pain. She takes oxycodone status post shoulder surgery in the spring. Will prescribe limited quantity Dilaudid tablets, place patient on Keflex for UTI and she will follow-up with her PCP closely. Strict return precautions given. Patient verbalizes understanding and agrees with plan of care.   Irean HongJade J Mercedes Fort, MD 11/16/15 [phone]

## 2016-05-06 NOTE — Pre-Procedure Instructions (Signed)
EKG  EKG reviewed and interpreted by myself shows sinus tachycardia 117 bpm, narrow QRS, normal axis, normal intervals, no ST changes.  ____________________________________________    RADIOLOGY  Chest x-ray is negative  ____________________________________________   INITIAL IMPRESSION / ASSESSMENT AND PLAN / ED COURSE  Pertinent labs & imaging results that were available during my care of the patient were reviewed by me and considered in my medical decision making (see chart for details).  The patient presents to the emergency department with right upper quadrant epigastric pain beginning at 10 PM last night. Patient has moderate right upper quadrant tenderness to palpation, moderate epigastric tenderness, no rebound or guarding. No distention. Abdomen is otherwise benign. Patient denies any chest pain or shortness breath. States some nausea but denies any vomiting. We will treat the patient's pain and nausea, obtain an ultrasound of the gallbladder to further evaluate. Patient's LFTs are largely within normal limits, lipase is pending. Slight leukocytosis of 13,700.  The patient continues with right upper quadrant abdominal pain. Ultrasound is negative. Labs have been largely unrevealing with normal LFTs, normal lipase. We will re-dose pain medication and proceed with a CT abdomen/pelvis will also send a second troponin.  ____________________________________________   FINAL CLINICAL IMPRESSION(S) / ED DIAGNOSES  Right Upper quadrant abdominal pain    Minna AntisKevin Paduchowski, MD 11/18/15 1432     Electronically signed by Minna AntisKevin Paduchowski, MD at 11/18/2015 2:32 PM      ED on 11/15/2015        Detailed Report

## 2016-05-06 NOTE — Patient Instructions (Signed)
  Your procedure is scheduled on: 05-13-16 (TUESDAY) Report to Same Day Surgery 2nd floor medical mall [facility](Medical Mall Entrance-take elevator on left to 2nd floor.  Check in with surgery information desk.) To find out your arrival time please call [phone] between 1PM - 3PM on 05-12-16 [facility](MONDAY)  Remember: Instructions that are not followed completely may result in serious medical risk, up to and including death, or upon the discretion of your surgeon and anesthesiologist your surgery may need to be rescheduled.    _x___ 1. Do not eat food or drink liquids after midnight. No gum chewing or hard candies.     __x__ 2. No Alcohol for 24 hours before or after surgery.   __x__3. No Smoking for 24 prior to surgery.   ____  4. Bring all medications with you on the day of surgery if instructed.    __x__ 5. Notify your doctor if there is any change in your medical condition     (cold, fever, infections).     Do not wear jewelry, make-up, hairpins, clips or nail polish.  Do not wear lotions, powders, or perfumes. You may wear deodorant.  Do not shave 48 hours prior to surgery. Men may shave face and neck.  Do not bring valuables to the hospital.    [facility] is not responsible for any belongings or valuables.               Contacts, dentures or bridgework may not be worn into surgery.  Leave your suitcase in the car. After surgery it may be brought to your room.  For patients admitted to the hospital, discharge time is determined by your treatment team.   Patients discharged the day of surgery will not be allowed to drive home.  You will need someone to drive you home and stay with you the night of your procedure.    Please read over the following fact sheets that you were given:     ____ Take anti-hypertensive (unless it includes a diuretic), cardiac, seizure, asthma, anti-reflux and psychiatric medicines. These include:  1. NONE  2.  3.  4.  5.  6.  ____Fleets enema or Magnesium  Citrate as directed.   _x___ Use CHG Soap or sage wipes as directed on instruction sheet   ____ Use inhalers on the day of surgery and bring to hospital day of surgery  ____ Stop Metformin and Janumet 2 days prior to surgery.    ____ Take 1/2 of usual insulin dose the night before surgery and none on the morning     surgery.   ____ Follow recommendations from Cardiologist, Pulmonologist or PCP regarding stopping Aspirin, Coumadin, Pllavix ,Eliquis, Effient, or Pradaxa, and Pletal.  X____Stop Anti-inflammatories such as Advil, Aleve, Ibuprofen, Motrin, Naproxen, Naprosyn, NABUMETONE, Goodies powders or aspirin products. OK to take Tylenol and Celebrex.   ____ Stop supplements until after surgery.     ____ Bring C-Pap to the hospital.

## 2016-05-07 ENCOUNTER — Encounter
Admission: RE | Admit: 2016-05-07 | Discharge: 2016-05-07 | Disposition: A | Discharge status: Home / Self Care | Payer: No Typology Code available for payment source | Source: Ambulatory Visit | Attending: Surgery | Admitting: Surgery

## 2016-05-07 DIAGNOSIS — Z01818 Encounter for other preprocedural examination: Secondary | ICD-10-CM | POA: Diagnosis not present

## 2016-05-07 HISTORY — DX: Anemia, unspecified: D64.9

## 2016-05-07 LAB — BASIC METABOLIC PANEL
ANION GAP: 10 (ref 5–15)
BUN: 15 mg/dL (ref 6–20)
CALCIUM: 9.2 mg/dL (ref 8.9–10.3)
CO2: 24 mmol/L (ref 22–32)
Chloride: 105 mmol/L (ref 101–111)
Creatinine, Ser: 0.66 mg/dL (ref 0.44–1.00)
Glucose, Bld: 90 mg/dL (ref 65–99)
Potassium: 3.8 mmol/L (ref 3.5–5.1)
Sodium: 139 mmol/L (ref 135–145)

## 2016-05-07 LAB — CBC
HCT: 37 % (ref 35.0–47.0)
HEMOGLOBIN: 12.5 g/dL (ref 12.0–16.0)
MCH: 29.7 pg (ref 26.0–34.0)
MCHC: 33.7 g/dL (ref 32.0–36.0)
MCV: 87.9 fL (ref 80.0–100.0)
Platelets: 458 10*3/uL — ABNORMAL HIGH (ref 150–440)
RBC: 4.21 MIL/uL (ref 3.80–5.20)
RDW: 15.8 % — ABNORMAL HIGH (ref 11.5–14.5)
WBC: 7.8 10*3/uL (ref 3.6–11.0)

## 2016-05-12 MED ORDER — LACTATED RINGERS IV SOLN
INTRAVENOUS | Status: DC
  Administered 2016-05-13: 10:00:00 via INTRAVENOUS

## 2016-05-12 MED ORDER — CEFAZOLIN SODIUM-DEXTROSE 2-4 GM/100ML-% IV SOLN
2.0000 g | Freq: Once | INTRAVENOUS | Status: AC
  Administered 2016-05-13: 2 g via INTRAVENOUS

## 2016-05-12 MED ORDER — FAMOTIDINE 20 MG PO TABS
20.0000 mg | ORAL_TABLET | Freq: Once | ORAL | Status: AC
  Administered 2016-05-13: 20 mg via ORAL

## 2016-05-13 ENCOUNTER — Ambulatory Visit
Admission: RE | Admit: 2016-05-13 | Discharge: 2016-05-13 | Disposition: A | Discharge status: Home / Self Care | Payer: No Typology Code available for payment source | Source: Ambulatory Visit | Attending: Surgery | Admitting: Surgery

## 2016-05-13 ENCOUNTER — Encounter
Admission: RE | Disposition: A | Discharge status: Home / Self Care | Payer: Self-pay | Source: Ambulatory Visit | Attending: Surgery

## 2016-05-13 ENCOUNTER — Ambulatory Visit: Payer: No Typology Code available for payment source | Admitting: Anesthesiology

## 2016-05-13 ENCOUNTER — Encounter: Payer: Self-pay | Admitting: *Deleted

## 2016-05-13 DIAGNOSIS — M65811 Other synovitis and tenosynovitis, right shoulder: Secondary | ICD-10-CM | POA: Diagnosis not present

## 2016-05-13 DIAGNOSIS — M7541 Impingement syndrome of right shoulder: Secondary | ICD-10-CM | POA: Diagnosis not present

## 2016-05-13 DIAGNOSIS — X58XXXA Exposure to other specified factors, initial encounter: Secondary | ICD-10-CM | POA: Insufficient documentation

## 2016-05-13 DIAGNOSIS — M25511 Pain in right shoulder: Secondary | ICD-10-CM | POA: Diagnosis present

## 2016-05-13 DIAGNOSIS — M75111 Incomplete rotator cuff tear or rupture of right shoulder, not specified as traumatic: Secondary | ICD-10-CM | POA: Diagnosis not present

## 2016-05-13 DIAGNOSIS — I10 Essential (primary) hypertension: Secondary | ICD-10-CM | POA: Diagnosis not present

## 2016-05-13 DIAGNOSIS — M6788 Other specified disorders of synovium and tendon, other site: Secondary | ICD-10-CM | POA: Insufficient documentation

## 2016-05-13 DIAGNOSIS — M19011 Primary osteoarthritis, right shoulder: Secondary | ICD-10-CM | POA: Insufficient documentation

## 2016-05-13 DIAGNOSIS — S43491A Other sprain of right shoulder joint, initial encounter: Secondary | ICD-10-CM | POA: Insufficient documentation

## 2016-05-13 HISTORY — PX: SHOULDER ARTHROSCOPY WITH OPEN ROTATOR CUFF REPAIR: SHX6092

## 2016-05-13 HISTORY — PX: SHOULDER ARTHROSCOPY WITH DEBRIDEMENT AND BICEP TENDON REPAIR: SHX5690

## 2016-05-13 SURGERY — ARTHROSCOPY, SHOULDER WITH REPAIR, ROTATOR CUFF, OPEN
Anesthesia: General | Site: Shoulder | Laterality: Right | Wound class: Clean

## 2016-05-13 MED ORDER — LIDOCAINE HCL (PF) 2 % IJ SOLN
INTRAMUSCULAR | Status: AC
  Filled 2016-05-13: qty 2

## 2016-05-13 MED ORDER — FENTANYL CITRATE (PF) 100 MCG/2ML IJ SOLN
INTRAMUSCULAR | Status: DC | PRN
  Administered 2016-05-13: 150 ug via INTRAVENOUS

## 2016-05-13 MED ORDER — FENTANYL CITRATE (PF) 100 MCG/2ML IJ SOLN
INTRAMUSCULAR | Status: AC
  Administered 2016-05-13: 50 ug via INTRAVENOUS
  Filled 2016-05-13: qty 2

## 2016-05-13 MED ORDER — MIDAZOLAM HCL 2 MG/2ML IJ SOLN
INTRAMUSCULAR | Status: AC
  Administered 2016-05-13: 1 mg via INTRAVENOUS
  Filled 2016-05-13: qty 2

## 2016-05-13 MED ORDER — FENTANYL CITRATE (PF) 100 MCG/2ML IJ SOLN
25.0000 ug | INTRAMUSCULAR | Status: DC | PRN
  Administered 2016-05-13 (×2): 25 ug via INTRAVENOUS
  Administered 2016-05-13 (×2): 50 ug via INTRAVENOUS

## 2016-05-13 MED ORDER — EPINEPHRINE PF 1 MG/ML IJ SOLN
INTRAMUSCULAR | Status: AC
  Filled 2016-05-13: qty 2

## 2016-05-13 MED ORDER — FENTANYL CITRATE (PF) 100 MCG/2ML IJ SOLN
50.0000 ug | Freq: Once | INTRAMUSCULAR | Status: AC
  Administered 2016-05-13: 50 ug via INTRAVENOUS

## 2016-05-13 MED ORDER — FENTANYL CITRATE (PF) 100 MCG/2ML IJ SOLN
INTRAMUSCULAR | Status: AC
  Administered 2016-05-13: 25 ug via INTRAVENOUS
  Filled 2016-05-13: qty 2

## 2016-05-13 MED ORDER — ONDANSETRON HCL 4 MG/2ML IJ SOLN: 4.0000 mg | Freq: Four times a day (QID) | INTRAMUSCULAR | Status: DC | PRN

## 2016-05-13 MED ORDER — BUPIVACAINE-EPINEPHRINE (PF) 0.5% -1:200000 IJ SOLN
INTRAMUSCULAR | Status: AC
  Filled 2016-05-13: qty 30

## 2016-05-13 MED ORDER — FAMOTIDINE 20 MG PO TABS
ORAL_TABLET | ORAL | Status: AC
  Administered 2016-05-13: 20 mg via ORAL
  Filled 2016-05-13: qty 1

## 2016-05-13 MED ORDER — FENTANYL CITRATE (PF) 100 MCG/2ML IJ SOLN: 25.0000 ug | INTRAMUSCULAR | Status: DC | PRN

## 2016-05-13 MED ORDER — ROCURONIUM BROMIDE 50 MG/5ML IV SOLN
INTRAVENOUS | Status: AC
  Filled 2016-05-13: qty 1

## 2016-05-13 MED ORDER — PHENYLEPHRINE HCL 10 MG/ML IJ SOLN
INTRAMUSCULAR | Status: DC | PRN
  Administered 2016-05-13: 200 ug via INTRAVENOUS

## 2016-05-13 MED ORDER — HYDROMORPHONE HCL 2 MG PO TABS
ORAL_TABLET | ORAL | Status: AC
  Filled 2016-05-13: qty 1

## 2016-05-13 MED ORDER — SUGAMMADEX SODIUM 200 MG/2ML IV SOLN
INTRAVENOUS | Status: DC | PRN
  Administered 2016-05-13: 150 mg via INTRAVENOUS

## 2016-05-13 MED ORDER — OXYCODONE HCL 5 MG PO TABS
5.0000 mg | ORAL_TABLET | Freq: Once | ORAL | Status: AC | PRN
  Administered 2016-05-13: 5 mg via ORAL

## 2016-05-13 MED ORDER — FENTANYL CITRATE (PF) 250 MCG/5ML IJ SOLN
INTRAMUSCULAR | Status: AC
  Filled 2016-05-13: qty 5

## 2016-05-13 MED ORDER — ONDANSETRON HCL 4 MG/2ML IJ SOLN
INTRAMUSCULAR | Status: DC | PRN
  Administered 2016-05-13: 4 mg via INTRAVENOUS

## 2016-05-13 MED ORDER — ONDANSETRON HCL 4 MG/2ML IJ SOLN
INTRAMUSCULAR | Status: AC
  Filled 2016-05-13: qty 2

## 2016-05-13 MED ORDER — HYDROMORPHONE HCL 2 MG PO TABS
2.0000 mg | ORAL_TABLET | ORAL | 0 refills | Status: DC | PRN
Start: 2016-05-13 — End: 2017-03-17

## 2016-05-13 MED ORDER — LIDOCAINE HCL (PF) 1 % IJ SOLN
INTRAMUSCULAR | Status: DC | PRN
  Administered 2016-05-13: 1 mL via INTRADERMAL

## 2016-05-13 MED ORDER — LIDOCAINE HCL (CARDIAC) 20 MG/ML IV SOLN
INTRAVENOUS | Status: DC | PRN
  Administered 2016-05-13 (×2): 100 mg via INTRAVENOUS

## 2016-05-13 MED ORDER — PROPOFOL 10 MG/ML IV BOLUS
INTRAVENOUS | Status: AC
  Filled 2016-05-13: qty 20

## 2016-05-13 MED ORDER — OXYCODONE HCL 5 MG PO TABS
ORAL_TABLET | ORAL | Status: AC
  Filled 2016-05-13: qty 1

## 2016-05-13 MED ORDER — OXYCODONE HCL 5 MG/5ML PO SOLN: 5.0000 mg | Freq: Once | ORAL | Status: DC | PRN

## 2016-05-13 MED ORDER — EPINEPHRINE PF 1 MG/ML IJ SOLN
INTRAMUSCULAR | Status: DC | PRN
  Administered 2016-05-13: 2 mL

## 2016-05-13 MED ORDER — ONDANSETRON HCL 4 MG PO TABS: 4.0000 mg | ORAL_TABLET | Freq: Four times a day (QID) | ORAL | Status: DC | PRN

## 2016-05-13 MED ORDER — CEFAZOLIN SODIUM-DEXTROSE 2-4 GM/100ML-% IV SOLN
INTRAVENOUS | Status: AC
  Filled 2016-05-13: qty 100

## 2016-05-13 MED ORDER — LIDOCAINE HCL (PF) 1 % IJ SOLN
INTRAMUSCULAR | Status: AC
  Filled 2016-05-13: qty 5

## 2016-05-13 MED ORDER — ROCURONIUM BROMIDE 100 MG/10ML IV SOLN
INTRAVENOUS | Status: DC | PRN
  Administered 2016-05-13: 50 mg via INTRAVENOUS

## 2016-05-13 MED ORDER — ROPIVACAINE HCL 5 MG/ML IJ SOLN
INTRAMUSCULAR | Status: AC
  Filled 2016-05-13: qty 40

## 2016-05-13 MED ORDER — PROPOFOL 10 MG/ML IV BOLUS
INTRAVENOUS | Status: DC | PRN
  Administered 2016-05-13: 150 mg via INTRAVENOUS
  Administered 2016-05-13: 20 mg via INTRAVENOUS
  Administered 2016-05-13: 30 mg via INTRAVENOUS

## 2016-05-13 MED ORDER — HYDROMORPHONE HCL 2 MG PO TABS
2.0000 mg | ORAL_TABLET | ORAL | Status: DC | PRN
  Administered 2016-05-13: 2 mg via ORAL

## 2016-05-13 MED ORDER — BUPIVACAINE-EPINEPHRINE 0.5% -1:200000 IJ SOLN
INTRAMUSCULAR | Status: DC | PRN
  Administered 2016-05-13: 30 mL

## 2016-05-13 MED ORDER — ROPIVACAINE HCL 5 MG/ML IJ SOLN
INTRAMUSCULAR | Status: DC | PRN
  Administered 2016-05-13 (×3): 10 mL via PERINEURAL

## 2016-05-13 MED ORDER — ONDANSETRON 4 MG PO TBDP
4.0000 mg | ORAL_TABLET | Freq: Three times a day (TID) | ORAL | 2 refills | Status: DC | PRN
Start: 2016-05-13 — End: 2017-03-17

## 2016-05-13 MED ORDER — METOCLOPRAMIDE HCL 10 MG PO TABS: 5.0000 mg | ORAL_TABLET | Freq: Three times a day (TID) | ORAL | Status: DC | PRN

## 2016-05-13 MED ORDER — DEXAMETHASONE SODIUM PHOSPHATE 4 MG/ML IJ SOLN
INTRAMUSCULAR | Status: DC | PRN
  Administered 2016-05-13: 10 mg via INTRAVENOUS

## 2016-05-13 MED ORDER — OXYCODONE HCL 5 MG PO TABS: 5.0000 mg | ORAL_TABLET | Freq: Once | ORAL | Status: DC | PRN

## 2016-05-13 MED ORDER — MIDAZOLAM HCL 2 MG/2ML IJ SOLN
INTRAMUSCULAR | Status: AC
  Filled 2016-05-13: qty 2

## 2016-05-13 MED ORDER — METOCLOPRAMIDE HCL 5 MG/ML IJ SOLN: 5.0000 mg | Freq: Three times a day (TID) | INTRAMUSCULAR | Status: DC | PRN

## 2016-05-13 MED ORDER — OXYCODONE HCL 5 MG/5ML PO SOLN: 5.0000 mg | Freq: Once | ORAL | Status: AC | PRN

## 2016-05-13 MED ORDER — DEXAMETHASONE SODIUM PHOSPHATE 10 MG/ML IJ SOLN
INTRAMUSCULAR | Status: AC
  Filled 2016-05-13: qty 1

## 2016-05-13 MED ORDER — MIDAZOLAM HCL 2 MG/2ML IJ SOLN
1.0000 mg | Freq: Once | INTRAMUSCULAR | Status: AC
  Administered 2016-05-13: 1 mg via INTRAVENOUS

## 2016-05-13 MED ORDER — POTASSIUM CHLORIDE IN NACL 20-0.9 MEQ/L-% IV SOLN: INTRAVENOUS | Status: DC

## 2016-05-13 MED ORDER — PHENYLEPHRINE HCL 10 MG/ML IJ SOLN
INTRAMUSCULAR | Status: DC | PRN
  Administered 2016-05-13: 20 ug/min via INTRAVENOUS

## 2016-05-13 SURGICAL SUPPLY — 45 items
ANCHOR JUGGERKNOT WTAP NDL 2.9 (Anchor) ×4 IMPLANT
ANCHOR SUT QUATTRO KNTLS 4.5 (Anchor) ×2 IMPLANT
BIT DRILL JUGRKNT W/NDL BIT2.9 (DRILL) ×1 IMPLANT
BLADE FULL RADIUS 3.5 (BLADE) ×2 IMPLANT
BUR ACROMIONIZER 4.0 (BURR) ×2 IMPLANT
CANNULA SHAVER 8MMX76MM (CANNULA) ×2 IMPLANT
CHLORAPREP W/TINT 26ML (MISCELLANEOUS) ×2 IMPLANT
COVER MAYO STAND STRL (DRAPES) ×2 IMPLANT
DRAPE IMP U-DRAPE 54X76 (DRAPES) ×4 IMPLANT
DRILL JUGGERKNOT W/NDL BIT 2.9 (DRILL) ×2
DRSG OPSITE POSTOP 4X8 (GAUZE/BANDAGES/DRESSINGS) ×2 IMPLANT
ELECT REM PT RETURN 9FT ADLT (ELECTROSURGICAL) ×2
ELECTRODE REM PT RTRN 9FT ADLT (ELECTROSURGICAL) ×1 IMPLANT
GAUZE PETRO XEROFOAM 1X8 (MISCELLANEOUS) ×2 IMPLANT
GAUZE SPONGE 4X4 12PLY STRL (GAUZE/BANDAGES/DRESSINGS) ×2 IMPLANT
GLOVE BIO SURGEON STRL SZ7.5 (GLOVE) ×4 IMPLANT
GLOVE BIO SURGEON STRL SZ8 (GLOVE) ×4 IMPLANT
GLOVE BIOGEL PI IND STRL 8 (GLOVE) ×1 IMPLANT
GLOVE BIOGEL PI INDICATOR 8 (GLOVE) ×1
GLOVE INDICATOR 8.0 STRL GRN (GLOVE) ×2 IMPLANT
GOWN STRL REUS W/ TWL LRG LVL3 (GOWN DISPOSABLE) ×1 IMPLANT
GOWN STRL REUS W/ TWL XL LVL3 (GOWN DISPOSABLE) ×1 IMPLANT
GOWN STRL REUS W/TWL LRG LVL3 (GOWN DISPOSABLE) ×1
GOWN STRL REUS W/TWL XL LVL3 (GOWN DISPOSABLE) ×1
GRASPER SUT 15 45D LOW PRO (SUTURE) IMPLANT
IV LACTATED RINGER IRRG 3000ML (IV SOLUTION) ×2
IV LR IRRIG 3000ML ARTHROMATIC (IV SOLUTION) ×2 IMPLANT
MANIFOLD NEPTUNE II (INSTRUMENTS) ×2 IMPLANT
MASK FACE SPIDER DISP (MASK) ×2 IMPLANT
MAT BLUE FLOOR 46X72 FLO (MISCELLANEOUS) ×2 IMPLANT
NDL MAYO CATGUT SZ5 (NEEDLE)
NDL SUT 5 .5 CRC TPR PNT MAYO (NEEDLE) IMPLANT
NEEDLE REVERSE CUT 1/2 CRC (NEEDLE) IMPLANT
PACK ARTHROSCOPY SHOULDER (MISCELLANEOUS) ×2 IMPLANT
SLING ARM LRG DEEP (SOFTGOODS) ×2 IMPLANT
SLING ULTRA II LG (MISCELLANEOUS) ×2 IMPLANT
STAPLER SKIN PROX 35W (STAPLE) ×2 IMPLANT
STRAP SAFETY BODY (MISCELLANEOUS) ×2 IMPLANT
SUT ETHIBOND 0 MO6 C/R (SUTURE) ×2 IMPLANT
SUT VIC AB 2-0 CT1 27 (SUTURE) ×2
SUT VIC AB 2-0 CT1 TAPERPNT 27 (SUTURE) ×2 IMPLANT
TAPE MICROFOAM 4IN (TAPE) ×2 IMPLANT
TUBING ARTHRO INFLOW-ONLY STRL (TUBING) ×2 IMPLANT
TUBING CONNECTING 10 (TUBING) ×2 IMPLANT
WAND HAND CNTRL MULTIVAC 90 (MISCELLANEOUS) ×2 IMPLANT

## 2016-05-13 NOTE — Anesthesia Procedure Notes (Signed)
Procedure Name: Intubation Date/Time: 05/13/2016 1:17 PM Performed by: Shirlee LimerickMARION, Wassim Kirksey Pre-anesthesia Checklist: Patient identified, Emergency Drugs available, Suction available and Patient being monitored Patient Re-evaluated:Patient Re-evaluated prior to inductionOxygen Delivery Method: Circle system utilized Preoxygenation: Pre-oxygenation with 100% oxygen Intubation Type: IV induction Laryngoscope Size: Miller and 3 Grade View: Grade I Number of attempts: 1 Placement Confirmation: ETT inserted through vocal cords under direct vision,  positive ETCO2 and breath sounds checked- equal and bilateral Secured at: 21 cm Tube secured with: Tape Dental Injury: Teeth and Oropharynx as per pre-operative assessment

## 2016-05-13 NOTE — Anesthesia Preprocedure Evaluation (Addendum)
Anesthesia Evaluation  Patient identified by MRN, date of birth, ID band Patient awake    Reviewed: Allergy & Precautions, H&P , NPO status , Patient's Chart, lab work & pertinent test results  History of Anesthesia Complications Negative for: history of anesthetic complications  Airway Mallampati: III  TM Distance: >3 FB Neck ROM: full    Dental  (+) Poor Dentition, Missing, Chipped, Partial Lower   Pulmonary neg pulmonary ROS, neg shortness of breath,    Pulmonary exam normal breath sounds clear to auscultation       Cardiovascular Exercise Tolerance: Good hypertension, (-) angina(-) Past MI and (-) DOE Normal cardiovascular exam Rhythm:regular Rate:Normal     Neuro/Psych negative neurological ROS  negative psych ROS   GI/Hepatic Neg liver ROS, GERD  Controlled,  Endo/Other  negative endocrine ROS  Renal/GU      Musculoskeletal  (+) Arthritis ,   Abdominal   Peds  Hematology negative hematology ROS (+)   Anesthesia Other Findings Past Medical History: No date: Anemia     Comment: H/O No date: Arthritis No date: GERD (gastroesophageal reflux disease)     Comment: H/O No date: Hypertension     Comment: H/O OVER A SHORT PERIOD OF TIME- LOST WEIGHT               AND IS NO LONGER TAKING BP MED PER PCP  Past Surgical History: No date: APPENDECTOMY 06/05/2015: SHOULDER ARTHROSCOPY Right     Comment: Procedure: ARTHROSCOPY SHOULDER;  Surgeon:               Christena FlakeJohn J Poggi, MD;  Location: [facility] ORS;                Service: Orthopedics;  Laterality: Right; No date: SINUS IRRIGATION  BMI    Body Mass Index:  40.57 kg/m      Reproductive/Obstetrics negative OB ROS                             Anesthesia Physical Anesthesia Plan  ASA: III  Anesthesia Plan: General ETT   Post-op Pain Management: GA combined w/ Regional for post-op pain   Induction:   Airway Management Planned:    Additional Equipment:   Intra-op Plan:   Post-operative Plan:   Informed Consent: I have reviewed the patients History and Physical, chart, labs and discussed the procedure including the risks, benefits and alternatives for the proposed anesthesia with the patient or authorized representative who has indicated his/her understanding and acceptance.   Dental Advisory Given  Plan Discussed with: Anesthesiologist, CRNA and Surgeon  Anesthesia Plan Comments:        Anesthesia Quick Evaluation

## 2016-05-13 NOTE — Discharge Instructions (Addendum)
Keep dressing dry and intact.  May shower after dressing changed on post-op day #4 (Saturday).  Cover staples with Band-Aids after drying off. Apply ice frequently to shoulder. Resume Relafen 500 mg BID with meals. Take Dilaudid as prescribed when needed.  May supplement with ES Tylenol if necessary. Keep shoulder immobilizer on at all times except may remove for bathing purposes. Follow-up in 10-14 days or as scheduled.  AMBULATORY SURGERY  DISCHARGE INSTRUCTIONS   1) The drugs that you were given will stay in your system until tomorrow so for the next 24 hours you should not:  A) Drive an automobile B) Make any legal decisions C) Drink any alcoholic beverage   2) You may resume regular meals tomorrow.  Today it is better to start with liquids and gradually work up to solid foods.  You may eat anything you prefer, but it is better to start with liquids, then soup and crackers, and gradually work up to solid foods.   3) Please notify your doctor immediately if you have any unusual bleeding, trouble breathing, redness and pain at the surgery site, drainage, fever, or pain not relieved by medication.    4) Additional Instructions:    Please contact your physician with any problems or Same Day Surgery at [phone], Monday through Friday 6 am to 4 pm, or [place] at [facility] Main number at [phone].

## 2016-05-13 NOTE — Anesthesia Procedure Notes (Signed)
Anesthesia Regional Block: Interscalene brachial plexus block   Pre-Anesthetic Checklist: ,, timeout performed, Correct Patient, Correct Site, Correct Laterality, Correct Procedure, Correct Position, site marked, Risks and benefits discussed,  Surgical consent,  Pre-op evaluation,  At surgeon's request and post-op pain management  Laterality: Upper and Right  Prep: chloraprep       Needles:  Injection technique: Single-shot  Needle Type: Stimiplex     Needle Length: 5cm  Needle Gauge: 22     Additional Needles:   Procedures: ultrasound guided,,,,,,,,  Narrative:  Start time: 05/13/2016 12:01 PM End time: 05/13/2016 12:05 PM Injection made incrementally with aspirations every 5 mL.  Performed by: Personally  Anesthesiologist: Margorie JohnPISCITELLO, JOSEPH K  Additional Notes: Functioning IV was confirmed and monitors were applied.  A 50mm 22ga Stimuplex needle was used. Sterile prep,hand hygiene and sterile gloves were used.  Negative aspiration and negative test dose prior to incremental administration of local anesthetic. The patient tolerated the procedure well with no immediate complications.

## 2016-05-13 NOTE — Transfer of Care (Signed)
Immediate Anesthesia Transfer of Care Note  Patient: Sydney Collins  Procedure(s) Performed: Procedure(s): SHOULDER ARTHROSCOPY WITH OPEN ROTATOR CUFF REPAIR (Right) SHOULDER ARTHROSCOPY WITH DEBRIDEMENT AND BICEP TENDON REPAIR (Right)  Patient Location: PACU  Anesthesia Type:General  Level of Consciousness: awake, alert , oriented and patient cooperative  Airway & Oxygen Therapy: Patient Spontanous Breathing and Patient connected to nasal cannula oxygen  Post-op Assessment: Report given to RN and Post -op Vital signs reviewed and stable  Post vital signs: Reviewed and stable  Last Vitals:  Vitals:   05/13/16 1234 05/13/16 1259  BP: 117/80   Pulse: (!) 58 62  Resp: 17 18  Temp:      Last Pain:  Vitals:   05/13/16 0957  TempSrc: Oral  PainSc: 5       Patients Stated Pain Goal: 2 (05/13/16 0957)  Complications: No apparent anesthesia complications

## 2016-05-13 NOTE — Op Note (Signed)
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  Postoperative diagnosis: Impingement/tendinopathy with recurrent rotator cuff tear, biceps tendinopathy, and early degenerative joint disease, right shoulder.  Procedure: Limited arthroscopic debridement, arthroscopic subacromial decompression, mini-open rotator cuff repair, and mini-open biceps tenodesis, right shoulder.  Anesthesia: General endotracheal with interscalene block placed preoperatively by the anesthesiologist.  Surgeon:   Maryagnes Amos, MD  Assistant:   Janell Quiet, PA-C  Findings: As above. There was a near full thickness articular surface tear of the posterior portion of the supraspinatus tendon. The remainder of the rotator cuff appeared intact. There were grade 2-3 chondral malacia changes involving the glenoid and humerus. Moderate labral fraying was noted posteriorly, posteroinferiorly, and anteriorly without frank detachment from the glenoid. There was mild biceps tendinopathy noted as well.  Complications: None  Fluids:   900 cc  Estimated blood loss: 5 cc  Tourniquet time: None  Drains: None  Closure: Staples   Brief clinical note: The patient is a 56 year old female who is now nearly 1 year status post a right shoulder arthroscopy with debridement, decompression, and repair of a partial-thickness rotator cuff tear of her right shoulder. The patient really had an injury 4-6 weeks postoperatively, but did not want to consider further surgery. Therefore, she continued nonsurgical treatment. Despite physical therapy, steroid injections, medications, rest, etc., her right shoulder pain and weakness have persisted. The patient's history and examination are consistent with a recurrent rotator cuff tear. These findings were confirmed by MRI scan. The patient  presents at this time for definitive management of her shoulder symptoms.  Procedure: The patient underwent placement of an interscalene block by the anesthesiologist in the preoperative holding area before she was brought into the operating room and lain in the supine position. The patient then underwent general endotracheal intubation and anesthesia before being repositioned in the beach chair position using the beach chair positioner. The right shoulder and upper extremity were prepped with ChloraPrep solution before being draped sterilely. Preoperative antibiotics were administered. A timeout was performed to confirm the proper surgical site before the expected portal sites and incision site were injected with 0.5% Sensorcaine with epinephrine. A posterior portal was created and the glenohumeral joint thoroughly inspected with the findings as described above. An anterior portal was created using an outside-in technique. The labrum and rotator cuff were further probed, again confirming the above-noted findings. The areas of labral fraying or debrider back to stable margins using the full-radius resector, as were some areas of synovitis superiorly. The ArthroCare wand was inserted and used to release the biceps tendon from its labral anchor. It also was used obtain hemostasis as well as to "anneal" the labrum anteriorly and inferiorly. The instruments were removed from the joint after suctioning the excess fluid.  The camera was repositioned through the posterior portal into the subacromial space. A separate lateral portal was created using an outside-in technique. The 3.5 mm full-radius resector was introduced and used to perform a subtotal bursectomy. The ArthroCare wand was then inserted and used to remove the periosteal tissue off the undersurface of the anterior third of the acromion as well as to recess the coracoacromial ligament from its attachment along the anterior and lateral margins of the  acromion. No additional bony resection was felt to be necessary. The instruments were then removed from the subacromial space after suctioning the excess fluid.  Utilizing  the previous incision, an approximately 5-6 cm incision was made over the anterolateral aspect of the shoulder beginning at the anterolateral corner of the acromion and extending distally in line with the bicipital groove. This incision was carried down through the subcutaneous tissues to expose the deltoid fascia. The raphae between the anterior and middle thirds was identified and this plane developed to provide access into the subacromial space. Additional bursal tissues were debrided sharply using Metzenbaum scissors. The rotator cuff tear was readily identified. The margins were debrided sharply with a #15 blade and the exposed greater tuberosity roughened with a rongeur. The tear was repaired using a single Biomet 2.9 mm JuggerKnot anchor. These sutures were then brought back laterally and secured using a [employer] QuatroLink anchor to create a two-layer closure. An apparent watertight closure was obtained.  The bicipital groove was identified by palpation and opened for 1-1.5 cm. The biceps tendon stump was retrieved through this defect. The floor of the bicipital groove was roughened with a curet before another Biomet 2.9 mm JuggerKnot anchor was inserted. Both sets of sutures were passed through the biceps tendon and tied securely to effect the tenodesis. The bicipital sheath was reapproximated using two #0 Ethibond interrupted sutures, incorporating the biceps tendon to further reinforce the tenodesis.  The wound was copiously irrigated with sterile saline solution before the deltoid raphae was reapproximated using 2-0 Vicryl interrupted sutures. The subcutaneous tissues were closed in two layers using 2-0 Vicryl interrupted sutures before the skin was closed using staples. The portal sites also were closed using staples. A sterile  bulky dressing was applied to the shoulder before the arm was placed into a shoulder immobilizer. The patient was then awakened, extubated, and returned to the recovery room in satisfactory condition after tolerating the procedure well.

## 2016-05-13 NOTE — Anesthesia Post-op Follow-up Note (Cosign Needed)
Anesthesia QCDR form completed.        

## 2016-05-13 NOTE — H&P (Signed)
Paper H&P to be scanned into permanent record. H&P reviewed and patient re-examined. No changes. 

## 2016-05-14 ENCOUNTER — Encounter: Payer: Self-pay | Admitting: Surgery

## 2016-05-15 NOTE — Anesthesia Postprocedure Evaluation (Signed)
Anesthesia Post Note  Patient: Sydney MccoyJeannie M Collins  Procedure(s) Performed: Procedure(s) (LRB): SHOULDER ARTHROSCOPY WITH OPEN ROTATOR CUFF REPAIR (Right) SHOULDER ARTHROSCOPY WITH DEBRIDEMENT AND BICEP TENDON REPAIR (Right)  Patient location during evaluation: PACU Anesthesia Type: General Level of consciousness: awake and alert Pain management: pain level controlled Vital Signs Assessment: post-procedure vital signs reviewed and stable Respiratory status: spontaneous breathing, nonlabored ventilation, respiratory function stable and patient connected to nasal cannula oxygen Cardiovascular status: blood pressure returned to baseline and stable Postop Assessment: no signs of nausea or vomiting Anesthetic complications: no     Last Vitals:  Vitals:   05/13/16 1547 05/13/16 1630  BP: 137/76 (!) 139/92  Pulse: 64 71  Resp: 16 16  Temp: 36.2 C     Last Pain:  Vitals:   05/14/16 0853  TempSrc:   PainSc: 0-No pain                 Cleda MccreedyJoseph K Piscitello

## 2016-12-05 ENCOUNTER — Other Ambulatory Visit: Payer: Self-pay | Admitting: Surgery

## 2016-12-05 DIAGNOSIS — M19011 Primary osteoarthritis, right shoulder: Secondary | ICD-10-CM

## 2016-12-05 DIAGNOSIS — M7581 Other shoulder lesions, right shoulder: Secondary | ICD-10-CM

## 2016-12-05 DIAGNOSIS — M24111 Other articular cartilage disorders, right shoulder: Secondary | ICD-10-CM

## 2016-12-11 ENCOUNTER — Ambulatory Visit
Admission: RE | Admit: 2016-12-11 | Discharge: 2016-12-11 | Disposition: A | Discharge status: Home / Self Care | Payer: Medicaid Other | Source: Ambulatory Visit | Attending: Surgery | Admitting: Surgery

## 2016-12-11 ENCOUNTER — Ambulatory Visit: Payer: No Typology Code available for payment source

## 2016-12-11 DIAGNOSIS — M7581 Other shoulder lesions, right shoulder: Secondary | ICD-10-CM | POA: Insufficient documentation

## 2016-12-11 DIAGNOSIS — M24111 Other articular cartilage disorders, right shoulder: Secondary | ICD-10-CM | POA: Insufficient documentation

## 2016-12-11 DIAGNOSIS — M19011 Primary osteoarthritis, right shoulder: Secondary | ICD-10-CM | POA: Diagnosis not present

## 2017-03-10 ENCOUNTER — Other Ambulatory Visit: Payer: Self-pay

## 2017-03-10 ENCOUNTER — Encounter
Admission: RE | Admit: 2017-03-10 | Discharge: 2017-03-10 | Disposition: A | Discharge status: Home / Self Care | Payer: Medicaid Other | Source: Ambulatory Visit | Attending: Surgery | Admitting: Surgery

## 2017-03-10 DIAGNOSIS — Z91040 Latex allergy status: Secondary | ICD-10-CM | POA: Insufficient documentation

## 2017-03-10 DIAGNOSIS — Z808 Family history of malignant neoplasm of other organs or systems: Secondary | ICD-10-CM | POA: Diagnosis not present

## 2017-03-10 DIAGNOSIS — Z01812 Encounter for preprocedural laboratory examination: Secondary | ICD-10-CM | POA: Diagnosis not present

## 2017-03-10 DIAGNOSIS — M75121 Complete rotator cuff tear or rupture of right shoulder, not specified as traumatic: Secondary | ICD-10-CM | POA: Insufficient documentation

## 2017-03-10 DIAGNOSIS — Z8249 Family history of ischemic heart disease and other diseases of the circulatory system: Secondary | ICD-10-CM | POA: Insufficient documentation

## 2017-03-10 DIAGNOSIS — M19011 Primary osteoarthritis, right shoulder: Secondary | ICD-10-CM | POA: Diagnosis not present

## 2017-03-10 HISTORY — DX: Other complications of anesthesia, initial encounter: T88.59XA

## 2017-03-10 HISTORY — DX: Adverse effect of unspecified anesthetic, initial encounter: T41.45XA

## 2017-03-10 HISTORY — DX: Sleep apnea, unspecified: G47.30

## 2017-03-10 NOTE — Patient Instructions (Signed)
  Your procedure is scheduled on: 03-17-17 TUESDAY Report to Same Day Surgery 2nd floor medical mall [facility](Medical Mall Entrance-take elevator on left to 2nd floor.  Check in with surgery information desk.) To find out your arrival time please call [phone] between 1PM - 3PM on 03-16-17 MONDAY  Remember: Instructions that are not followed completely may result in serious medical risk, up to and including death, or upon the discretion of your surgeon and anesthesiologist your surgery may need to be rescheduled.    _x___ 1. Do not eat food after midnight the night before your procedure. NO GUM OR CANDY AFTER MIDNIGHT.  You may drink clear liquids up to 2 hours before you are scheduled to arrive at the hospital for your procedure.  Do not drink clear liquids within 2 hours of your scheduled arrival to the hospital.  Clear liquids include  --Water or Apple juice without pulp  --Clear carbohydrate beverage such as ClearFast or Gatorade  --Black Coffee or Clear Tea (No milk, no creamers, do not add anything to the coffee or Tea     __x__ 2. No Alcohol for 24 hours before or after surgery.   __x__3. No Smoking for 24 prior to surgery.   ____  4. Bring all medications with you on the day of surgery if instructed.    __x__ 5. Notify your doctor if there is any change in your medical condition     (cold, fever, infections).     Do not wear jewelry, make-up, hairpins, clips or nail polish.  Do not wear lotions, powders, or perfumes. You may wear deodorant.  Do not shave 48 hours prior to surgery. Men may shave face and neck.  Do not bring valuables to the hospital.    [facility] is not responsible for any belongings or valuables.               Contacts, dentures or bridgework may not be worn into surgery.  Leave your suitcase in the car. After surgery it may be brought to your room.  For patients admitted to the hospital, discharge time is determined by your treatment team.   Patients  discharged the day of surgery will not be allowed to drive home.  You will need someone to drive you home and stay with you the night of your procedure.    Please read over the following fact sheets that you were given:   [facility] Preparing for Surgery and or MRSA Information   _x___ TAKE THE FOLLOWING MEDICATION WITH A SMALL SIP OF WATER. These include:  1. OXYCODONE  2.  3.  4.  5.  6.  ____Fleets enema or Magnesium Citrate as directed.   _x___ Use CHG Soap or sage wipes as directed on instruction sheet   ____ Use inhalers on the day of surgery and bring to hospital day of surgery  ____ Stop Metformin and Janumet 2 days prior to surgery.    ____ Take 1/2 of usual insulin dose the night before surgery and none on the morning surgery.   ____ Follow recommendations from Cardiologist, Pulmonologist or PCP regarding stopping Aspirin, Coumadin, Plavix ,Eliquis, Effient, or Pradaxa, and Pletal.  X____Stop Anti-inflammatories such as Advil, Aleve, Ibuprofen, Motrin, Naproxen, Naprosyn, Goodies powders or aspirin products NOW-OK to take Tylenol    _x___ Stop supplements until after surgery-STOP MELATONIN NOW-MAY RESUME AFTER SURGERY   ____ Bring C-Pap to the hospital.

## 2017-03-11 ENCOUNTER — Encounter
Admission: RE | Admit: 2017-03-11 | Discharge: 2017-03-11 | Disposition: A | Discharge status: Home / Self Care | Payer: Medicaid Other | Source: Ambulatory Visit | Attending: Surgery | Admitting: Surgery

## 2017-03-11 DIAGNOSIS — Z01812 Encounter for preprocedural laboratory examination: Secondary | ICD-10-CM | POA: Diagnosis present

## 2017-03-11 LAB — CBC
HEMATOCRIT: 41.1 % (ref 35.0–47.0)
Hemoglobin: 13.6 g/dL (ref 12.0–16.0)
MCH: 31 pg (ref 26.0–34.0)
MCHC: 33.1 g/dL (ref 32.0–36.0)
MCV: 93.8 fL (ref 80.0–100.0)
Platelets: 418 10*3/uL (ref 150–440)
RBC: 4.39 MIL/uL (ref 3.80–5.20)
RDW: 14.1 % (ref 11.5–14.5)
WBC: 6.1 10*3/uL (ref 3.6–11.0)

## 2017-03-16 ENCOUNTER — Encounter: Payer: Self-pay | Admitting: *Deleted

## 2017-03-16 MED ORDER — CEFAZOLIN SODIUM-DEXTROSE 2-4 GM/100ML-% IV SOLN
2.0000 g | Freq: Once | INTRAVENOUS | Status: AC
  Administered 2017-03-17: 2 g via INTRAVENOUS

## 2017-03-16 NOTE — Pre-Procedure Instructions (Signed)
  EKG  EKG reviewed and interpreted by myself shows sinus tachycardia 117 bpm, narrow QRS, normal axis, normal intervals, no ST changes.  ____________________________________________    RADIOLOGY  Chest x-ray is negative  ____________________________________________   INITIAL IMPRESSION / ASSESSMENT AND PLAN / ED COURSE  Pertinent labs & imaging results that were available during my care of the patient were reviewed by me and considered in my medical decision making (see chart for details).  The patient presents to the emergency department with right upper quadrant epigastric pain beginning at 10 PM last night. Patient has moderate right upper quadrant tenderness to palpation, moderate epigastric tenderness, no rebound or guarding. No distention. Abdomen is otherwise benign. Patient denies any chest pain or shortness breath. States some nausea but denies any vomiting. We will treat the patient's pain and nausea, obtain an ultrasound of the gallbladder to further evaluate. Patient's LFTs are largely within normal limits, lipase is pending. Slight leukocytosis of 13,700.  The patient continues with right upper quadrant abdominal pain. Ultrasound is negative. Labs have been largely unrevealing with normal LFTs, normal lipase. We will re-dose pain medication and proceed with a CT abdomen/pelvis will also send a second troponin.  ____________________________________________   FINAL CLINICAL IMPRESSION(S) / ED DIAGNOSES  Right Upper quadrant abdominal pain    Minna AntisKevin Paduchowski, MD 11/18/15 1432           Electronically signed by Minna AntisPaduchowski, Kevin, MD at 11/18/2015 2:32 PM     ED on 11/15/2015        Detailed Report

## 2017-03-17 ENCOUNTER — Encounter
Admission: RE | Disposition: A | Discharge status: Home / Self Care | Payer: Self-pay | Source: Ambulatory Visit | Attending: Surgery

## 2017-03-17 ENCOUNTER — Ambulatory Visit: Payer: Medicaid Other | Admitting: Anesthesiology

## 2017-03-17 ENCOUNTER — Ambulatory Visit
Admission: RE | Admit: 2017-03-17 | Discharge: 2017-03-17 | Disposition: A | Discharge status: Home / Self Care | Payer: Medicaid Other | Source: Ambulatory Visit | Attending: Surgery | Admitting: Surgery

## 2017-03-17 ENCOUNTER — Other Ambulatory Visit: Payer: Self-pay

## 2017-03-17 ENCOUNTER — Encounter: Payer: Self-pay | Admitting: *Deleted

## 2017-03-17 DIAGNOSIS — I10 Essential (primary) hypertension: Secondary | ICD-10-CM | POA: Insufficient documentation

## 2017-03-17 DIAGNOSIS — M19011 Primary osteoarthritis, right shoulder: Secondary | ICD-10-CM | POA: Diagnosis present

## 2017-03-17 DIAGNOSIS — R7303 Prediabetes: Secondary | ICD-10-CM | POA: Diagnosis not present

## 2017-03-17 DIAGNOSIS — Z79899 Other long term (current) drug therapy: Secondary | ICD-10-CM | POA: Diagnosis not present

## 2017-03-17 DIAGNOSIS — K219 Gastro-esophageal reflux disease without esophagitis: Secondary | ICD-10-CM | POA: Insufficient documentation

## 2017-03-17 HISTORY — PX: SHOULDER ARTHROSCOPY WITH OPEN ROTATOR CUFF REPAIR: SHX6092

## 2017-03-17 SURGERY — ARTHROSCOPY, SHOULDER WITH REPAIR, ROTATOR CUFF, OPEN
Anesthesia: General | Laterality: Right | Wound class: Clean

## 2017-03-17 MED ORDER — HYDROMORPHONE HCL 2 MG PO TABS: 2.0000 mg | ORAL_TABLET | ORAL | 0 refills | Status: DC | PRN

## 2017-03-17 MED ORDER — KETOROLAC TROMETHAMINE 30 MG/ML IJ SOLN
INTRAMUSCULAR | Status: AC
  Filled 2017-03-17: qty 1

## 2017-03-17 MED ORDER — TETRACAINE HCL 0.5 % OP SOLN
OPHTHALMIC | Status: AC
  Filled 2017-03-17: qty 4

## 2017-03-17 MED ORDER — SUCCINYLCHOLINE CHLORIDE 20 MG/ML IJ SOLN
INTRAMUSCULAR | Status: DC | PRN
  Administered 2017-03-17: 120 mg via INTRAVENOUS

## 2017-03-17 MED ORDER — BUPIVACAINE LIPOSOME 1.3 % IJ SUSP
INTRAMUSCULAR | Status: AC
  Filled 2017-03-17: qty 20

## 2017-03-17 MED ORDER — MIDAZOLAM HCL 2 MG/2ML IJ SOLN
INTRAMUSCULAR | Status: AC
  Filled 2017-03-17: qty 2

## 2017-03-17 MED ORDER — BUPIVACAINE LIPOSOME 1.3 % IJ SUSP
INTRAMUSCULAR | Status: DC | PRN
  Administered 2017-03-17: 13 mL via PERINEURAL
  Administered 2017-03-17: 7 mL via PERINEURAL

## 2017-03-17 MED ORDER — FENTANYL CITRATE (PF) 100 MCG/2ML IJ SOLN
INTRAMUSCULAR | Status: AC
  Administered 2017-03-17: 50 ug via INTRAVENOUS
  Filled 2017-03-17: qty 2

## 2017-03-17 MED ORDER — PROPOFOL 10 MG/ML IV BOLUS
INTRAVENOUS | Status: AC
  Filled 2017-03-17: qty 20

## 2017-03-17 MED ORDER — FENTANYL CITRATE (PF) 100 MCG/2ML IJ SOLN
INTRAMUSCULAR | Status: AC
  Filled 2017-03-17: qty 2

## 2017-03-17 MED ORDER — DEXAMETHASONE SODIUM PHOSPHATE 10 MG/ML IJ SOLN
INTRAMUSCULAR | Status: AC
  Filled 2017-03-17: qty 1

## 2017-03-17 MED ORDER — PROPOFOL 10 MG/ML IV BOLUS
INTRAVENOUS | Status: DC | PRN
  Administered 2017-03-17: 180 mg via INTRAVENOUS

## 2017-03-17 MED ORDER — SUCCINYLCHOLINE CHLORIDE 20 MG/ML IJ SOLN
INTRAMUSCULAR | Status: AC
  Filled 2017-03-17: qty 1

## 2017-03-17 MED ORDER — FAMOTIDINE 20 MG PO TABS
20.0000 mg | ORAL_TABLET | Freq: Once | ORAL | Status: AC
  Administered 2017-03-17: 20 mg via ORAL

## 2017-03-17 MED ORDER — BUPIVACAINE HCL (PF) 0.5 % IJ SOLN
INTRAMUSCULAR | Status: DC | PRN
  Administered 2017-03-17: 3 mL via PERINEURAL
  Administered 2017-03-17: 7 mL via PERINEURAL

## 2017-03-17 MED ORDER — LIDOCAINE HCL (PF) 1 % IJ SOLN
INTRAMUSCULAR | Status: DC | PRN
  Administered 2017-03-17: 1 mL via SUBCUTANEOUS

## 2017-03-17 MED ORDER — ROCURONIUM BROMIDE 50 MG/5ML IV SOLN
INTRAVENOUS | Status: AC
  Filled 2017-03-17: qty 1

## 2017-03-17 MED ORDER — SUGAMMADEX SODIUM 200 MG/2ML IV SOLN
INTRAVENOUS | Status: DC | PRN
  Administered 2017-03-17: 220 mg via INTRAVENOUS

## 2017-03-17 MED ORDER — ACETAMINOPHEN 10 MG/ML IV SOLN
INTRAVENOUS | Status: AC
  Filled 2017-03-17: qty 100

## 2017-03-17 MED ORDER — LACTATED RINGERS IV SOLN
INTRAVENOUS | Status: DC
  Administered 2017-03-17: 12:00:00 via INTRAVENOUS

## 2017-03-17 MED ORDER — ACETAMINOPHEN 10 MG/ML IV SOLN
INTRAVENOUS | Status: DC | PRN
  Administered 2017-03-17: 1000 mg via INTRAVENOUS

## 2017-03-17 MED ORDER — FAMOTIDINE 20 MG PO TABS
ORAL_TABLET | ORAL | Status: AC
  Administered 2017-03-17: 20 mg via ORAL
  Filled 2017-03-17: qty 1

## 2017-03-17 MED ORDER — KETOROLAC TROMETHAMINE 30 MG/ML IJ SOLN
INTRAMUSCULAR | Status: DC | PRN
  Administered 2017-03-17: 30 mg via INTRAVENOUS

## 2017-03-17 MED ORDER — OXYCODONE HCL 5 MG/5ML PO SOLN: 5.0000 mg | Freq: Once | ORAL | Status: DC | PRN

## 2017-03-17 MED ORDER — CEFAZOLIN SODIUM-DEXTROSE 2-4 GM/100ML-% IV SOLN
INTRAVENOUS | Status: AC
  Filled 2017-03-17: qty 100

## 2017-03-17 MED ORDER — OXYCODONE HCL 5 MG PO TABS: 5.0000 mg | ORAL_TABLET | Freq: Once | ORAL | Status: DC | PRN

## 2017-03-17 MED ORDER — MIDAZOLAM HCL 2 MG/2ML IJ SOLN
1.0000 mg | Freq: Once | INTRAMUSCULAR | Status: AC
  Administered 2017-03-17: 1 mg via INTRAVENOUS

## 2017-03-17 MED ORDER — BUPIVACAINE-EPINEPHRINE 0.5% -1:200000 IJ SOLN
INTRAMUSCULAR | Status: DC | PRN
  Administered 2017-03-17: 30 mL

## 2017-03-17 MED ORDER — LIDOCAINE HCL (CARDIAC) 20 MG/ML IV SOLN
INTRAVENOUS | Status: DC | PRN
  Administered 2017-03-17: 60 mg via INTRAVENOUS

## 2017-03-17 MED ORDER — ONDANSETRON 4 MG PO TBDP: 4.0000 mg | ORAL_TABLET | Freq: Three times a day (TID) | ORAL | 1 refills | Status: DC | PRN

## 2017-03-17 MED ORDER — SEVOFLURANE IN SOLN
RESPIRATORY_TRACT | Status: AC
  Filled 2017-03-17: qty 250

## 2017-03-17 MED ORDER — BUPIVACAINE-EPINEPHRINE (PF) 0.5% -1:200000 IJ SOLN
INTRAMUSCULAR | Status: AC
  Filled 2017-03-17: qty 30

## 2017-03-17 MED ORDER — ROCURONIUM BROMIDE 100 MG/10ML IV SOLN
INTRAVENOUS | Status: DC | PRN
  Administered 2017-03-17: 15 mg via INTRAVENOUS
  Administered 2017-03-17: 5 mg via INTRAVENOUS

## 2017-03-17 MED ORDER — MIDAZOLAM HCL 5 MG/5ML IJ SOLN
INTRAMUSCULAR | Status: DC | PRN
  Administered 2017-03-17: 2 mg via INTRAVENOUS

## 2017-03-17 MED ORDER — BUPIVACAINE HCL (PF) 0.5 % IJ SOLN
INTRAMUSCULAR | Status: AC
  Filled 2017-03-17: qty 10

## 2017-03-17 MED ORDER — MIDAZOLAM HCL 2 MG/2ML IJ SOLN
INTRAMUSCULAR | Status: AC
  Administered 2017-03-17: 1 mg via INTRAVENOUS
  Filled 2017-03-17: qty 2

## 2017-03-17 MED ORDER — SUGAMMADEX SODIUM 200 MG/2ML IV SOLN
INTRAVENOUS | Status: AC
  Filled 2017-03-17: qty 2

## 2017-03-17 MED ORDER — ONDANSETRON HCL 4 MG/2ML IJ SOLN
INTRAMUSCULAR | Status: AC
  Filled 2017-03-17: qty 2

## 2017-03-17 MED ORDER — PHENYLEPHRINE HCL 10 MG/ML IJ SOLN
INTRAMUSCULAR | Status: DC | PRN
  Administered 2017-03-17 (×2): 50 ug via INTRAVENOUS

## 2017-03-17 MED ORDER — ONDANSETRON HCL 4 MG/2ML IJ SOLN
INTRAMUSCULAR | Status: DC | PRN
  Administered 2017-03-17: 4 mg via INTRAVENOUS

## 2017-03-17 MED ORDER — DEXAMETHASONE SODIUM PHOSPHATE 10 MG/ML IJ SOLN
INTRAMUSCULAR | Status: DC | PRN
  Administered 2017-03-17: 5 mg via INTRAVENOUS

## 2017-03-17 MED ORDER — FENTANYL CITRATE (PF) 100 MCG/2ML IJ SOLN
INTRAMUSCULAR | Status: DC | PRN
  Administered 2017-03-17: 25 ug via INTRAVENOUS
  Administered 2017-03-17: 100 ug via INTRAVENOUS
  Administered 2017-03-17: 50 ug via INTRAVENOUS
  Administered 2017-03-17: 25 ug via INTRAVENOUS

## 2017-03-17 MED ORDER — LIDOCAINE HCL (PF) 1 % IJ SOLN
INTRAMUSCULAR | Status: AC
  Filled 2017-03-17: qty 5

## 2017-03-17 MED ORDER — EPINEPHRINE PF 1 MG/ML IJ SOLN
INTRAMUSCULAR | Status: AC
  Filled 2017-03-17: qty 2

## 2017-03-17 MED ORDER — FENTANYL CITRATE (PF) 100 MCG/2ML IJ SOLN: 25.0000 ug | INTRAMUSCULAR | Status: DC | PRN

## 2017-03-17 MED ORDER — FENTANYL CITRATE (PF) 100 MCG/2ML IJ SOLN
50.0000 ug | Freq: Once | INTRAMUSCULAR | Status: AC
  Administered 2017-03-17: 50 ug via INTRAVENOUS

## 2017-03-17 SURGICAL SUPPLY — 47 items
ANCHOR TENDON REGENETEN (Staple) ×3 IMPLANT
ANCHORS BONE REGENETEN (Anchor) ×3 IMPLANT
BIT DRILL JUGRKNT W/NDL BIT2.9 (DRILL) IMPLANT
BLADE FULL RADIUS 3.5 (BLADE) ×3 IMPLANT
BUR ACROMIONIZER 4.0 (BURR) ×3 IMPLANT
CANNULA SHAVER 8MMX76MM (CANNULA) ×3 IMPLANT
CHLORAPREP W/TINT 26ML (MISCELLANEOUS) ×3 IMPLANT
COVER MAYO STAND STRL (DRAPES) ×3 IMPLANT
DRAPE IMP U-DRAPE 54X76 (DRAPES) ×6 IMPLANT
DRILL JUGGERKNOT W/NDL BIT 2.9 (DRILL)
DRSG OPSITE POSTOP 4X8 (GAUZE/BANDAGES/DRESSINGS) ×3 IMPLANT
ELECT REM PT RETURN 9FT ADLT (ELECTROSURGICAL) ×3
ELECTRODE REM PT RTRN 9FT ADLT (ELECTROSURGICAL) ×1 IMPLANT
GAUZE PETRO XEROFOAM 1X8 (MISCELLANEOUS) ×3 IMPLANT
GAUZE SPONGE 4X4 12PLY STRL (GAUZE/BANDAGES/DRESSINGS) ×3 IMPLANT
GLOVE BIO SURGEON STRL SZ7.5 (GLOVE) ×6 IMPLANT
GLOVE BIO SURGEON STRL SZ8 (GLOVE) ×6 IMPLANT
GLOVE BIOGEL PI IND STRL 8 (GLOVE) ×1 IMPLANT
GLOVE BIOGEL PI INDICATOR 8 (GLOVE) ×2
GLOVE INDICATOR 8.0 STRL GRN (GLOVE) ×3 IMPLANT
GOWN STRL REUS W/ TWL LRG LVL3 (GOWN DISPOSABLE) ×1 IMPLANT
GOWN STRL REUS W/ TWL XL LVL3 (GOWN DISPOSABLE) ×1 IMPLANT
GOWN STRL REUS W/TWL LRG LVL3 (GOWN DISPOSABLE) ×2
GOWN STRL REUS W/TWL XL LVL3 (GOWN DISPOSABLE) ×2
GRASPER SUT 15 45D LOW PRO (SUTURE) IMPLANT
IMPLANT REGENETEN MEDIUM (Shoulder) ×3 IMPLANT
IV LACTATED RINGER IRRG 3000ML (IV SOLUTION) ×4
IV LR IRRIG 3000ML ARTHROMATIC (IV SOLUTION) ×2 IMPLANT
MANIFOLD NEPTUNE II (INSTRUMENTS) ×3 IMPLANT
MASK FACE SPIDER DISP (MASK) ×3 IMPLANT
MAT BLUE FLOOR 46X72 FLO (MISCELLANEOUS) ×3 IMPLANT
NDL MAYO CATGUT SZ5 (NEEDLE)
NDL SUT 5 .5 CRC TPR PNT MAYO (NEEDLE) IMPLANT
NEEDLE REVERSE CUT 1/2 CRC (NEEDLE) IMPLANT
PACK ARTHROSCOPY SHOULDER (MISCELLANEOUS) ×3 IMPLANT
SLING ARM LRG DEEP (SOFTGOODS) ×3 IMPLANT
SLING ULTRA II LG (MISCELLANEOUS) ×3 IMPLANT
STAPLER SKIN PROX 35W (STAPLE) ×3 IMPLANT
STRAP SAFETY BODY (MISCELLANEOUS) ×3 IMPLANT
SUT ETHIBOND 0 MO6 C/R (SUTURE) ×3 IMPLANT
SUT VIC AB 2-0 CT1 27 (SUTURE) ×4
SUT VIC AB 2-0 CT1 TAPERPNT 27 (SUTURE) ×2 IMPLANT
TAPE MICROFOAM 4IN (TAPE) ×3 IMPLANT
TUBING ARTHRO INFLOW-ONLY STRL (TUBING) ×3 IMPLANT
TUBING CONNECTING 10 (TUBING) ×2 IMPLANT
TUBING CONNECTING 10' (TUBING) ×1
WAND HAND CNTRL MULTIVAC 90 (MISCELLANEOUS) ×3 IMPLANT

## 2017-03-17 NOTE — Anesthesia Postprocedure Evaluation (Signed)
Anesthesia Post Note  Patient: Sydney Collins  Procedure(s) Performed: SHOULDER ARTHROSCOPY WITH OPEN ROTATOR CUFF REPAIR (Right )  Patient location during evaluation: PACU Anesthesia Type: General Level of consciousness: awake and alert Pain management: pain level controlled Vital Signs Assessment: post-procedure vital signs reviewed and stable Respiratory status: spontaneous breathing and respiratory function stable Cardiovascular status: stable Anesthetic complications: no     Last Vitals:  Vitals:   03/17/17 1707 03/17/17 1716  BP: (!) 130/100 135/79  Pulse: 84 80  Resp: 15 15  Temp:    SpO2: 100% 98%    Last Pain:  Vitals:   03/17/17 1716  TempSrc:   PainSc: 0-No pain                 KEPHART,WILLIAM K

## 2017-03-17 NOTE — Transfer of Care (Signed)
Immediate Anesthesia Transfer of Care Note  Patient: Sydney Collins  Procedure(s) Performed: SHOULDER ARTHROSCOPY WITH OPEN ROTATOR CUFF REPAIR (Right )  Patient Location: PACU  Anesthesia Type:General and GA combined with regional for post-op pain  Level of Consciousness: sedated  Airway & Oxygen Therapy: Patient Spontanous Breathing and Patient connected to face mask oxygen  Post-op Assessment: Report given to RN and Post -op Vital signs reviewed and stable  Post vital signs: Reviewed and stable  Last Vitals:  Vitals:   03/17/17 1430 03/17/17 1445  BP: 123/88 126/76  Pulse: 66 66  Resp: 16 18  Temp:    SpO2: 100% 100%    Last Pain:  Vitals:   03/17/17 1246  TempSrc:   PainSc: 0-No pain         Complications: No apparent anesthesia complications

## 2017-03-17 NOTE — Anesthesia Preprocedure Evaluation (Signed)
Anesthesia Evaluation  Patient identified by MRN, date of birth, ID band Patient awake    Reviewed: Allergy & Precautions, H&P , NPO status , Patient's Chart, lab work & pertinent test results  History of Anesthesia Complications (+) history of anesthetic complications  Airway Mallampati: III  TM Distance: <3 FB Neck ROM: full    Dental  (+) Chipped, Poor Dentition, Missing, Partial Lower   Pulmonary neg shortness of breath, sleep apnea ,           Cardiovascular Exercise Tolerance: Good hypertension,      Neuro/Psych negative neurological ROS  negative psych ROS   GI/Hepatic Neg liver ROS, GERD  Medicated and Controlled,  Endo/Other  negative endocrine ROS  Renal/GU      Musculoskeletal  (+) Arthritis ,   Abdominal   Peds  Hematology negative hematology ROS (+)   Anesthesia Other Findings Past Medical History: No date: Anemia     Comment:  H/O No date: Arthritis No date: Complication of anesthesia     Comment:  could feel everything when she was being extubated No date: GERD (gastroesophageal reflux disease)     Comment:  H/O No date: Hypertension     Comment:  H/O OVER A SHORT PERIOD OF TIME- LOST WEIGHT AND IS NO               LONGER TAKING BP MED PER PCP 1999: Sleep apnea     Comment:  WAS TOLD SHE STOPPED BREATHING 80 TIMES DURING THE               NIGHT.  NEVER WAS GIVEN CPAP MACHINE PER PT  Past Surgical History: No date: APPENDECTOMY 06/05/2015: SHOULDER ARTHROSCOPY; Right     Comment:  Procedure: ARTHROSCOPY SHOULDER;  Surgeon: Christena FlakeJohn J Poggi,              MD;  Location: [facility] OR[facility];  Service: Orthopedics;                Laterality: Right; 05/13/2016: SHOULDER ARTHROSCOPY WITH DEBRIDEMENT AND BICEP TENDON  REPAIR; Right     Comment:  Procedure: SHOULDER ARTHROSCOPY WITH DEBRIDEMENT AND               BICEP TENDON REPAIR;  Surgeon: Christena FlakeJohn J Poggi, MD;                Location: [facility];  Service:  Orthopedics;  Laterality:               Right; 05/13/2016: SHOULDER ARTHROSCOPY WITH OPEN ROTATOR CUFF REPAIR; Right     Comment:  Procedure: SHOULDER ARTHROSCOPY WITH OPEN ROTATOR CUFF               REPAIR;  Surgeon: Christena FlakeJohn J Poggi, MD;  Location: [facility] OR[facility];               Service: Orthopedics;  Laterality: Right; No date: SINUS IRRIGATION  BMI    Body Mass Index:  43.75 kg/m      Reproductive/Obstetrics negative OB ROS                             Anesthesia Physical Anesthesia Plan  ASA: III  Anesthesia Plan: General ETT   Post-op Pain Management: GA combined w/ Regional for post-op pain   Induction: Intravenous  PONV Risk Score and Plan: 3 and Ondansetron, Dexamethasone and Midazolam  Airway Management Planned: Oral ETT  Additional Equipment:   Intra-op  Plan:   Post-operative Plan: Extubation in OR  Informed Consent: I have reviewed the patients History and Physical, chart, labs and discussed the procedure including the risks, benefits and alternatives for the proposed anesthesia with the patient or authorized representative who has indicated his/her understanding and acceptance.   Dental Advisory Given  Plan Discussed with: Anesthesiologist, CRNA and Surgeon  Anesthesia Plan Comments: (Patient consented for risks of anesthesia including but not limited to:  - adverse reactions to medications - damage to teeth, lips or other oral mucosa - sore throat or hoarseness - Damage to heart, brain, lungs or loss of life  Patient voiced understanding.)        Anesthesia Quick Evaluation

## 2017-03-17 NOTE — H&P (Signed)
Paper H&P to be scanned into permanent record. H&P reviewed and patient re-examined. No changes. 

## 2017-03-17 NOTE — Anesthesia Procedure Notes (Signed)
Procedure Name: Intubation Date/Time: 03/17/2017 2:37 PM Performed by: Dionne Bucy, CRNA Pre-anesthesia Checklist: Patient identified, Patient being monitored, Timeout performed, Emergency Drugs available and Suction available Patient Re-evaluated:Patient Re-evaluated prior to induction Oxygen Delivery Method: Circle system utilized Preoxygenation: Pre-oxygenation with 100% oxygen Induction Type: IV induction Ventilation: Mask ventilation without difficulty Laryngoscope Size: Mac and 3 Grade View: Grade II Tube type: Oral Tube size: 7.0 mm Number of attempts: 1 Airway Equipment and Method: Stylet Placement Confirmation: ETT inserted through vocal cords under direct vision,  positive ETCO2 and breath sounds checked- equal and bilateral Secured at: 21 cm Tube secured with: Tape Dental Injury: Teeth and Oropharynx as per pre-operative assessment

## 2017-03-17 NOTE — Anesthesia Procedure Notes (Signed)
Anesthesia Regional Block: Interscalene brachial plexus block   Pre-Anesthetic Checklist: ,, timeout performed, Correct Patient, Correct Site, Correct Laterality, Correct Procedure, Correct Position, site marked, Risks and benefits discussed,  Surgical consent,  Pre-op evaluation,  At surgeon's request and post-op pain management  Laterality: Upper and Right  Prep: chloraprep       Needles:  Injection technique: Single-shot  Needle Type: Stimiplex     Needle Length: 5cm  Needle Gauge: 22     Additional Needles:   Narrative:  Start time: 03/17/2017 12:20 PM End time: 03/17/2017 12:24 PM Injection made incrementally with aspirations every 5 mL.  Performed by: Personally  Anesthesiologist: Ryley Teater, Cleda MccreedyJoseph K, MD  Additional Notes:  Functioning IV was confirmed and monitors were applied.  A 50mm 22ga Stimuplex needle was used. Sterile prep,hand hygiene and sterile gloves were used.  Minimal sedation used for procedure.  No paresthesia endorsed by patient during the procedure.  Negative aspiration and negative test dose prior to incremental administration of local anesthetic. The patient tolerated the procedure well with no immediate complications.

## 2017-03-17 NOTE — Discharge Instructions (Signed)

## 2017-03-17 NOTE — Op Note (Signed)
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  Post-Op Diagnosis: Impingement/tendinopathy with labral fraying and underlying degenerative joint disease, right shoulder.  Procedure: Limited arthroscopic debridement and mini-open rotator cuff repair, right shoulder.  Anesthesia: General endotracheal with interscalene block placed preoperatively by the anesthesiologist.  Surgeon:   Maryagnes Amos, MD  Assistant:   Horris Latino, PA-C; Coral Spikes, PA-S  Findings: As above.  The labrum demonstrated some fraying superiorly and inferiorly without frank detachment from the glenoid.  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  The rotator cuff appear to be in excellent condition both from the articular side as well as from the bursal side.  Complications: None  Fluids:   900 cc  Estimated blood loss: 5 cc  Tourniquet time: None  Drains: None  Closure: Staples   Brief clinical note: The patient is a 57 year old female who is status post a right shoulder arthroscopy with  mini-open rotator cuff repair and biceps tenodesis performed 10 months ago. Despite extensive nonsurgical treatment, including physical therapy, medications, injections, activity modification, etc, the patient continues to experience pain in her shoulder. An ultrasound of the shoulder from 5 months ago suggested that the rotator cuff repair was intact. However, the patient's symptoms have persisted despite extensive nonoperative treatments, so she presents at this time for a right shoulder arthroscopy with debridement, release of subacromial adhesions, and possible repair of any recurrent rotator cuff  tear.  Procedure: The patient underwent placement of an interscalene block by the anesthesiologist in the preoperative holding area before being brought into the operating room and lain in the supine position. The patient then underwent general endotracheal intubation and anesthesia before being repositioned in the beach chair position using the beach chair positioner. The right shoulder and upper extremity were prepped with ChloraPrep solution before being draped sterilely. Preoperative antibiotics were administered. A timeout was performed to confirm the proper surgical site before the expected portal sites and incision site were injected with 0.5% Sensorcaine with epinephrine. A posterior portal was created and the glenohumeral joint thoroughly inspected with the findings as described above. An anterior portal was created using an outside-in technique. The labrum and rotator cuff were further probed, again confirming the above-noted findings.  Areas of labral fraying were debrided back to stable margins, as were several small areas of synovitis. The ArthroCare wand was inserted and used to obtain hemostasis as well as to "anneal" the labrum superiorly and anteriorly. The instruments were removed from the joint after suctioning the excess fluid.  The camera was repositioned through the posterior portal into the subacromial space. A separate lateral portal was created using an outside-in technique. The 3.5 mm full-radius resector was introduced and used to perform a subtotal bursectomy. The ArthroCare wand was then inserted and used to remove additional bursal tissues, as well as to obtain hemostasis. The rotator cuff was thoroughly inspected arthroscopically but no recurrent or new tears were identified. The instruments were then removed from the subacromial space after suctioning the excess fluid.  An approximately 4-5 cm incision was made over the anterolateral aspect of the shoulder beginning at the  anterolateral corner of the acromion and extending distally in line with the bicipital groove, ellipsing the old scar in the process. This  incision was carried down through the subcutaneous tissues to expose the deltoid fascia. The raphae between the anterior and middle thirds was identified and this plane developed to provide access into the subacromial space. Additional bursal tissues were debrided sharply using Metzenbaum scissors. The rotator cuff tear was thoroughly inspected again. In addition, it was carefully palpated. Again, there was no evidence for any partial or full-thickness recurrent rotator cuff tears.  Multiple adhesions in the subacromial space, as well as in the subcoracoid space were debrided/released in order to optimize the patient's motion and to try to reduce any symptoms.  Given the patient's persistent symptoms, it was elected to try to improve the quality of repaired rotator cuff tendon. Therefore, the area of prior repair was covered with a medium [employer] Hotelsephew Regeneron patch, securing it with two bone staples laterally and seven soft tissue anchors medially. The patch was noted to be stable with shoulder range of motion.  The wound was copiously irrigated with sterile saline solution before the deltoid raphae was reapproximated using 2-0 Vicryl interrupted sutures. The subcutaneous tissues were closed in two layers using 2-0 Vicryl interrupted sutures before the skin was closed using staples. The portal sites also were closed using staples. A sterile bulky dressing was applied to the shoulder before the arm was placed into a shoulder immobilizer. The patient was then awakened, extubated, and returned to the recovery room in satisfactory condition after tolerating the procedure well.

## 2017-03-17 NOTE — Anesthesia Post-op Follow-up Note (Signed)
Anesthesia QCDR form completed.        

## 2017-03-17 NOTE — OR Nursing (Signed)
Patient voided 450

## 2017-03-18 ENCOUNTER — Encounter: Payer: Self-pay | Admitting: Surgery

## 2017-06-19 ENCOUNTER — Emergency Department
Admission: EM | Admit: 2017-06-19 | Discharge: 2017-06-19 | Disposition: A | Discharge status: Home / Self Care | Payer: Medicaid Other | Attending: Emergency Medicine | Admitting: Emergency Medicine

## 2017-06-19 ENCOUNTER — Other Ambulatory Visit: Payer: Self-pay

## 2017-06-19 ENCOUNTER — Encounter: Payer: Self-pay | Admitting: Emergency Medicine

## 2017-06-19 DIAGNOSIS — H1031 Unspecified acute conjunctivitis, right eye: Secondary | ICD-10-CM

## 2017-06-19 DIAGNOSIS — Z79899 Other long term (current) drug therapy: Secondary | ICD-10-CM | POA: Diagnosis not present

## 2017-06-19 DIAGNOSIS — I10 Essential (primary) hypertension: Secondary | ICD-10-CM | POA: Diagnosis not present

## 2017-06-19 DIAGNOSIS — Z91040 Latex allergy status: Secondary | ICD-10-CM | POA: Insufficient documentation

## 2017-06-19 DIAGNOSIS — H538 Other visual disturbances: Secondary | ICD-10-CM | POA: Insufficient documentation

## 2017-06-19 DIAGNOSIS — H5789 Other specified disorders of eye and adnexa: Secondary | ICD-10-CM | POA: Diagnosis present

## 2017-06-19 LAB — CBC WITH DIFFERENTIAL/PLATELET
BASOS ABS: 0 10*3/uL (ref 0–0.1)
BASOS PCT: 1 %
EOS PCT: 5 %
Eosinophils Absolute: 0.2 10*3/uL (ref 0–0.7)
HCT: 37.3 % (ref 35.0–47.0)
Hemoglobin: 12.4 g/dL (ref 12.0–16.0)
LYMPHS PCT: 41 %
Lymphs Abs: 1.6 10*3/uL (ref 1.0–3.6)
MCH: 31.1 pg (ref 26.0–34.0)
MCHC: 33.4 g/dL (ref 32.0–36.0)
MCV: 93.1 fL (ref 80.0–100.0)
Monocytes Absolute: 0.3 10*3/uL (ref 0.2–0.9)
Monocytes Relative: 8 %
Neutro Abs: 1.7 10*3/uL (ref 1.4–6.5)
Neutrophils Relative %: 45 %
Platelets: 348 10*3/uL (ref 150–440)
RBC: 4 MIL/uL (ref 3.80–5.20)
RDW: 13.7 % (ref 11.5–14.5)
WBC: 3.8 10*3/uL (ref 3.6–11.0)

## 2017-06-19 LAB — BASIC METABOLIC PANEL
ANION GAP: 5 (ref 5–15)
BUN: 13 mg/dL (ref 6–20)
CALCIUM: 8.2 mg/dL — AB (ref 8.9–10.3)
CO2: 25 mmol/L (ref 22–32)
Chloride: 108 mmol/L (ref 101–111)
Creatinine, Ser: 0.84 mg/dL (ref 0.44–1.00)
GLUCOSE: 124 mg/dL — AB (ref 65–99)
POTASSIUM: 4.4 mmol/L (ref 3.5–5.1)
SODIUM: 138 mmol/L (ref 135–145)

## 2017-06-19 MED ORDER — CEPHALEXIN 500 MG PO CAPS: 500.0000 mg | ORAL_CAPSULE | Freq: Three times a day (TID) | ORAL | 0 refills | Status: AC

## 2017-06-19 MED ORDER — TETRACAINE HCL 0.5 % OP SOLN
2.0000 [drp] | Freq: Once | OPHTHALMIC | Status: AC
  Administered 2017-06-19: 2 [drp] via OPHTHALMIC

## 2017-06-19 MED ORDER — TETRACAINE HCL 0.5 % OP SOLN
OPHTHALMIC | Status: AC
  Administered 2017-06-19: 2 [drp] via OPHTHALMIC
  Filled 2017-06-19: qty 4

## 2017-06-19 MED ORDER — FLUORESCEIN SODIUM 1 MG OP STRP
1.0000 | ORAL_STRIP | Freq: Once | OPHTHALMIC | Status: AC
  Administered 2017-06-19: 1 via OPHTHALMIC

## 2017-06-19 MED ORDER — CEPHALEXIN 500 MG PO CAPS
500.0000 mg | ORAL_CAPSULE | Freq: Once | ORAL | Status: AC
  Administered 2017-06-19: 500 mg via ORAL
  Filled 2017-06-19: qty 1

## 2017-06-19 MED ORDER — FLUORESCEIN SODIUM 1 MG OP STRP
ORAL_STRIP | OPHTHALMIC | Status: AC
  Administered 2017-06-19: 1 via OPHTHALMIC
  Filled 2017-06-19: qty 1

## 2017-06-19 MED ORDER — CEPHALEXIN 500 MG PO CAPS: 500.0000 mg | ORAL_CAPSULE | Freq: Three times a day (TID) | ORAL | 0 refills | Status: DC

## 2017-06-19 NOTE — ED Provider Notes (Signed)
[facility] Emergency Department Provider Note  Time seen: 7:28 PM  [facility] vital [facility] and the nursing notes.   HISTORY  Chief Complaint Eye Problem    HPI Sydney Collins is a 57 y.o. female with a past medical history of anemia, gastric reflux, hypertension, presents emergency department for right eye discomfort and discharge.  According to the patient for the past 2-3 days she has been having a clear discharge from the right eye with pain and swelling to the eye.  States it feels irritated and itchy.  Saw her doctor 2 days ago who prescribed her polymyxin eyedrops.  States it is not improved over the past 2 days so she came to the emergency department for evaluation.  Patient states blurry vision due to the discharge in her eye.  States pain which she describes as more of a scratching of her eye.  Denies any known trauma.  Denies any fever.  Patient states she feels like her lymph nodes around her ear is swollen and was concerned that the infection could be spreading into her face so she came to the emergency department for evaluation.   Past Medical History:  Diagnosis Date  . Anemia    H/O  . Arthritis   . Complication of anesthesia    could feel everything when she was being extubated  . GERD (gastroesophageal reflux disease)    H/O  . Hypertension    H/O OVER A SHORT PERIOD OF TIME- LOST WEIGHT AND IS NO LONGER TAKING BP MED PER PCP  . Sleep apnea 1999   WAS TOLD SHE STOPPED BREATHING 80 TIMES DURING THE NIGHT.  NEVER WAS GIVEN CPAP MACHINE PER PT    There are no active problems to display for this patient.   Past Surgical History:  Procedure Laterality Date  . APPENDECTOMY    . SHOULDER ARTHROSCOPY Right 06/05/2015   Procedure: ARTHROSCOPY SHOULDER;  Surgeon: Christena FlakeJohn J Poggi, MD;  Location: [facility] ORS;  Service: Orthopedics;  Laterality: Right;  . SHOULDER ARTHROSCOPY WITH DEBRIDEMENT AND BICEP TENDON REPAIR Right 05/13/2016   Procedure: SHOULDER ARTHROSCOPY WITH DEBRIDEMENT AND BICEP TENDON REPAIR;  Surgeon: Christena FlakeJohn J Poggi, MD;  Location: [facility] ORS;  Service: Orthopedics;  Laterality: Right;  . SHOULDER ARTHROSCOPY WITH OPEN ROTATOR CUFF REPAIR Right 05/13/2016   Procedure: SHOULDER ARTHROSCOPY WITH OPEN ROTATOR CUFF REPAIR;  Surgeon: Christena FlakeJohn J Poggi, MD;  Location: [facility] ORS;  Service: Orthopedics;  Laterality: Right;  . SHOULDER ARTHROSCOPY WITH OPEN ROTATOR CUFF REPAIR Right 03/17/2017   Procedure: SHOULDER ARTHROSCOPY WITH OPEN ROTATOR CUFF REPAIR;  Surgeon: Christena FlakePoggi, John J, MD;  Location: [facility] ORS;  Service: Orthopedics;  Laterality: Right;  arthroscopic debridement   . SINUS IRRIGATION      Prior to Admission medications   Medication Sig Start Date End Date Taking? Authorizing Provider  acetaminophen (TYLENOL) 500 MG tablet Take 1,500 mg by mouth every 6 (six) hours as needed for moderate pain or headache.    [provider]  HYDROmorphone (DILAUDID) 2 MG tablet Take 1 tablet (2 mg total) by mouth every 4 (four) hours as needed for severe pain. 03/17/17   Poggi, Excell SeltzerJohn J, MD  Melatonin 10 MG TABS Take 1 tablet by mouth at bedtime.    [provider]  ondansetron (ZOFRAN ODT) 4 MG disintegrating tablet Take 1 tablet (4 mg total) by mouth every 8 (eight) hours as needed for nausea or vomiting. 03/17/17   Poggi, Excell SeltzerJohn J, MD  oxyCODONE (OXY IR/ROXICODONE) 5 MG immediate release tablet Take 5 mg by mouth every 8 (eight) hours.     [provider]    Allergies  Allergen Reactions  . Latex Other (See Comments)    blisters    No family history on file.  Social History Social History   Tobacco Use  . Smoking status: Never Smoker  . Smokeless tobacco: Never Used  Substance Use Topics  . Alcohol use: No  . Drug use: No    Review of Systems Constitutional: Negative for fever. Eyes: Right eye pain and discharge.  Blurred vision from the right eye. ENT: Feels like her lymph nodes around her right  ear or swelling.  No ear pain. Cardiovascular: Negative for chest pain. Respiratory: Negative for shortness of breath. Gastrointestinal: Negative for abdominal pain, vomiting  Genitourinary: Negative for urinary compaints Musculoskeletal: Negative for musculoskeletal complaints Skin: Negative for skin complaints  Neurological: Mild headache All other ROS negative  ____________________________________________   PHYSICAL EXAM:  VITAL SIGNS: ED Triage Vitals  Enc Vitals Group     BP 06/19/17 1830 (!) 141/92     Pulse --      Resp 06/19/17 1830 16     Temp 06/19/17 1830 98.5 F (36.9 C)     Temp Source 06/19/17 1830 Oral     SpO2 06/19/17 1830 98 %     Weight 06/19/17 1830 190 lb (86.2 kg)     Height 06/19/17 1830 5\' 3"  (1.6 m)     Head Circumference --      Peak Flow --      Pain Score 06/19/17 1829 6     Pain Loc --      Pain Edu? --      Excl. in [place]? --    Constitutional: Alert and oriented. Well appearing and in no distress. Eyes: Patient has moderate conjunctival injection of the right eye with apparent clear discharge/tearing.  Extraocular muscles intact, PERRLA. ENT   Head: Normocephalic and atraumatic.   Mouth/Throat: Mucous membranes are moist. Cardiovascular: Normal rate, regular rhythm. No murmur Respiratory: Normal respiratory effort without tachypnea nor retractions. Breath sounds are clear  Gastrointestinal: Soft and nontender. No distention Musculoskeletal: Nontender with normal range of motion in all extremities. Neurologic:  Normal speech and language. No gross focal neurologic deficits are appreciated. Skin:  Skin is warm, dry and intact.  Psychiatric: Mood and affect are normal. Speech and behavior are normal.   ____________________________________________   INITIAL IMPRESSION / ASSESSMENT AND PLAN / ED COURSE  Pertinent labs & imaging results that were available during my care of the patient were reviewed by me and considered in my medical  decision making (see chart for details).  Patient presents emergency department for right eye pain discharge and redness.  Exam is most consistent with conjunctivitis.  Given the patient has minimal to no improvement over the last 2 days despite eyedrops I have performed a Woods lamp examination which is normal, no signs of ulceration trauma or abrasion.  I have everted the eyelids, no signs of foreign body.  I performed tonometry pressures which are 19 x 3.  We will check basic labs.  Again exam most consistent at this time with conjunctivitis.  She does have mild lymphadenopathy around the right ear, but but no significant facial edema or erythema to suggest facial cellulitis or extraorbital involvement.  Extraocular muscles are intact, no entrapment, no proptosis.  Patient's blood work is normal including a normal white blood cell  count.  At this time given her exam most consistent with conjunctivitis I believe the patient should continue with her antibiotic eyedrops.  Given the patient's mild lymphadenopathy we will prescribe an oral antibiotic as a precaution.  Patient is to follow-up with her eye doctor on Monday for recheck/reevaluation.  I discussed return precautions for any decrease in vision or worsening eye pain.   ____________________________________________   FINAL CLINICAL IMPRESSION(S) / ED DIAGNOSES  Conjunctivitis    Minna Antis, MD 06/19/17 2003

## 2017-06-19 NOTE — ED Triage Notes (Signed)
Right eye swollen and draining clear liquid.  Right facial swelling noted.

## 2017-06-19 NOTE — ED Notes (Signed)
MD at bedside with woods lamp

## 2017-06-19 NOTE — Discharge Instructions (Addendum)
As we discussed please take your eyedrops as prescribed by your eye doctor.  Please take your oral antibiotics as prescribed in the emergency department today.  Please follow-up with your eye doctor on Monday for recheck/reevaluation.  Return to the emergency department for any decrease in vision, worsening of eye pain, or development of fever, or any other symptom personally concerning to yourself.

## 2017-06-19 NOTE — ED Triage Notes (Signed)
C/O right eye redness and swelling.  Initially sclera was red and seen by eye doctor and given drops.  Today c/o increased swelling and "knots behind ear and face".

## 2017-06-19 NOTE — ED Notes (Signed)
Reviewed discharge instructions, follow-up care, and prescriptions with patient. Patient verbalized understanding of all information reviewed. Patient stable, with no distress noted at this time.    

## 2017-06-25 ENCOUNTER — Emergency Department: Payer: Medicaid Other

## 2017-06-25 ENCOUNTER — Emergency Department
Admission: EM | Admit: 2017-06-25 | Discharge: 2017-06-25 | Disposition: A | Discharge status: Home / Self Care | Payer: Medicaid Other | Attending: Emergency Medicine | Admitting: Emergency Medicine

## 2017-06-25 ENCOUNTER — Encounter: Payer: Self-pay | Admitting: Emergency Medicine

## 2017-06-25 ENCOUNTER — Other Ambulatory Visit: Payer: Self-pay

## 2017-06-25 DIAGNOSIS — I10 Essential (primary) hypertension: Secondary | ICD-10-CM | POA: Insufficient documentation

## 2017-06-25 DIAGNOSIS — Z91040 Latex allergy status: Secondary | ICD-10-CM | POA: Diagnosis not present

## 2017-06-25 DIAGNOSIS — H1031 Unspecified acute conjunctivitis, right eye: Secondary | ICD-10-CM | POA: Diagnosis not present

## 2017-06-25 DIAGNOSIS — Z79899 Other long term (current) drug therapy: Secondary | ICD-10-CM | POA: Diagnosis not present

## 2017-06-25 DIAGNOSIS — H5789 Other specified disorders of eye and adnexa: Secondary | ICD-10-CM | POA: Diagnosis present

## 2017-06-25 LAB — CBC WITH DIFFERENTIAL/PLATELET
Basophils Absolute: 0 10*3/uL (ref 0–0.1)
Basophils Relative: 1 %
EOS ABS: 0.2 10*3/uL (ref 0–0.7)
Eosinophils Relative: 4 %
HEMATOCRIT: 42.2 % (ref 35.0–47.0)
HEMOGLOBIN: 14.1 g/dL (ref 12.0–16.0)
LYMPHS ABS: 1.8 10*3/uL (ref 1.0–3.6)
LYMPHS PCT: 31 %
MCH: 30.9 pg (ref 26.0–34.0)
MCHC: 33.5 g/dL (ref 32.0–36.0)
MCV: 92.2 fL (ref 80.0–100.0)
MONOS PCT: 6 %
Monocytes Absolute: 0.3 10*3/uL (ref 0.2–0.9)
NEUTROS PCT: 60 %
Neutro Abs: 3.6 10*3/uL (ref 1.4–6.5)
Platelets: 360 10*3/uL (ref 150–440)
RBC: 4.57 MIL/uL (ref 3.80–5.20)
RDW: 13.6 % (ref 11.5–14.5)
WBC: 6 10*3/uL (ref 3.6–11.0)

## 2017-06-25 LAB — BASIC METABOLIC PANEL
Anion gap: 8 (ref 5–15)
BUN: 18 mg/dL (ref 6–20)
CHLORIDE: 105 mmol/L (ref 101–111)
CO2: 27 mmol/L (ref 22–32)
CREATININE: 0.86 mg/dL (ref 0.44–1.00)
Calcium: 8.8 mg/dL — ABNORMAL LOW (ref 8.9–10.3)
GFR calc non Af Amer: >60 mL/min (ref >60)
Glucose, Bld: 85 mg/dL (ref 65–99)
POTASSIUM: 4.2 mmol/L (ref 3.5–5.1)
Sodium: 140 mmol/L (ref 135–145)

## 2017-06-25 MED ORDER — IOPAMIDOL (ISOVUE-300) INJECTION 61%
75.0000 mL | Freq: Once | INTRAVENOUS | Status: AC | PRN
  Administered 2017-06-25: 75 mL via INTRAVENOUS
  Filled 2017-06-25: qty 75

## 2017-06-25 MED ORDER — PREDNISOLONE-MOXIFLOXACIN 1-0.5 % OP SOLN: 2.0000 [drp] | Freq: Four times a day (QID) | OPHTHALMIC | 0 refills | Status: DC

## 2017-06-25 NOTE — ED Triage Notes (Signed)
Pt come into the ED via POV c/o bilateral eye irritation.  Patient has been diagnosed with conjunctivitis but she states that it has gotten worse and now it is in both eyes.  Patient in NAD at this time with even and unlabored respirations.

## 2017-06-25 NOTE — ED Provider Notes (Signed)
[facility] Emergency Department Provider Note  ____________________________________________   First MD Initiated Contact with Patient 06/25/17 [phone]     (approximate)  I have reviewed the triage vital signs and the nursing notes.   HISTORY  Chief Complaint Eye Pain    HPI AHMYA BERNICK is a 57 y.o. female presents emergency department complaining of bilateral knee pain.  She has been on an oral antibiotic and drops for her eyes.  Her ophthalmologist was concerned the other day that if she worsen that she might have a cellulitis behind the eye.  She states it does hurt to move her right eye.  She has some decreased vision.  States everything is blurry.  She denies any fever or chills at this time.  Past Medical History:  Diagnosis Date  . Anemia    H/O  . Arthritis   . Complication of anesthesia    could feel everything when she was being extubated  . GERD (gastroesophageal reflux disease)    H/O  . Hypertension    H/O OVER A SHORT PERIOD OF TIME- LOST WEIGHT AND IS NO LONGER TAKING BP MED PER PCP  . Sleep apnea 1999   WAS TOLD SHE STOPPED BREATHING 80 TIMES DURING THE NIGHT.  NEVER WAS GIVEN CPAP MACHINE PER PT    There are no active problems to display for this patient.   Past Surgical History:  Procedure Laterality Date  . APPENDECTOMY    . SHOULDER ARTHROSCOPY Right 06/05/2015   Procedure: ARTHROSCOPY SHOULDER;  Surgeon: Corky Mull, MD;  Location: [facility] ORS;  Service: Orthopedics;  Laterality: Right;  . SHOULDER ARTHROSCOPY WITH DEBRIDEMENT AND BICEP TENDON REPAIR Right 05/13/2016   Procedure: SHOULDER ARTHROSCOPY WITH DEBRIDEMENT AND BICEP TENDON REPAIR;  Surgeon: Corky Mull, MD;  Location: [facility] ORS;  Service: Orthopedics;  Laterality: Right;  . SHOULDER ARTHROSCOPY WITH OPEN ROTATOR CUFF REPAIR Right 05/13/2016   Procedure: SHOULDER ARTHROSCOPY WITH OPEN ROTATOR CUFF REPAIR;  Surgeon: Corky Mull, MD;  Location: [facility] ORS;  Service:  Orthopedics;  Laterality: Right;  . SHOULDER ARTHROSCOPY WITH OPEN ROTATOR CUFF REPAIR Right 03/17/2017   Procedure: SHOULDER ARTHROSCOPY WITH OPEN ROTATOR CUFF REPAIR;  Surgeon: Corky Mull, MD;  Location: [facility] ORS;  Service: Orthopedics;  Laterality: Right;  arthroscopic debridement   . SINUS IRRIGATION      Prior to Admission medications   Medication Sig Start Date End Date Taking? Authorizing Provider  acetaminophen (TYLENOL) 500 MG tablet Take 1,500 mg by mouth every 6 (six) hours as needed for moderate pain or headache.    [provider]  cephALEXin (KEFLEX) 500 MG capsule Take 1 capsule (500 mg total) by mouth 3 (three) times daily for 7 days. 06/19/17 06/26/17  Harvest Dark, MD  HYDROmorphone (DILAUDID) 2 MG tablet Take 1 tablet (2 mg total) by mouth every 4 (four) hours as needed for severe pain. 03/17/17   Poggi, Marshall Cork, MD  Melatonin 10 MG TABS Take 1 tablet by mouth at bedtime.    [provider]  ondansetron (ZOFRAN ODT) 4 MG disintegrating tablet Take 1 tablet (4 mg total) by mouth every 8 (eight) hours as needed for nausea or vomiting. 03/17/17   Poggi, Marshall Cork, MD  oxyCODONE (OXY IR/ROXICODONE) 5 MG immediate release tablet Take 5 mg by mouth every 8 (eight) hours.     [provider]  prednisoLONE-Moxifloxacin 1-0.5 % SOLN Apply 2 drops to eye 4 (four) times daily. 06/25/17   Fisher,  Linden Dolin, PA-C    Allergies Latex  No family history on file.  Social History Social History   Tobacco Use  . Smoking status: Never Smoker  . Smokeless tobacco: Never Used  Substance Use Topics  . Alcohol use: No  . Drug use: No    Review of Systems  Constitutional: No fever/chills Eyes: Positive visual changes.  As for eye redness and pain with movement of the eye ENT: No sore throat. Respiratory: Denies cough Genitourinary: Negative for dysuria. Musculoskeletal: Negative for back pain. Skin: Negative for  rash.    ____________________________________________   PHYSICAL EXAM:  VITAL SIGNS: ED Triage Vitals  Enc Vitals Group     BP 06/25/17 1539 (!) 107/55     Pulse Rate 06/25/17 1539 86     Resp 06/25/17 1539 18     Temp 06/25/17 1539 98.7 F (37.1 C)     Temp Source 06/25/17 1539 Oral     SpO2 06/25/17 1539 100 %     Weight 06/25/17 1541 202 lb (91.6 kg)     Height 06/25/17 1541 5' 3"  (1.6 m)     Head Circumference --      Peak Flow --      Pain Score 06/25/17 1540 5     Pain Loc --      Pain Edu? --      Excl. in [place]? --     Constitutional: Alert and oriented. Well appearing and in no acute distress. Eyes: Conjunctivae are injected bilaterally, the right eye is more swollen and prominent.  Extraocular motions reproduce pain in the right eye.  The left eye has normal ocular motions Head: Atraumatic. Nose: No congestion/rhinnorhea. Mouth/Throat: Mucous membranes are moist.   Cardiovascular: Normal rate, regular rhythm. Respiratory: Normal respiratory effort.  No retractions GU: deferred Musculoskeletal: FROM all extremities, warm and well perfused Neurologic:  Normal speech and language.  Skin:  Skin is warm, dry and intact. No rash noted. Psychiatric: Mood and affect are normal. Speech and behavior are normal.  ____________________________________________   LABS (all labs ordered are listed, but only abnormal results are displayed)  Labs Reviewed  BASIC METABOLIC PANEL - Abnormal; Notable for the following components:      Result Value   Calcium 8.8 (*)    All other components within normal limits  CBC WITH DIFFERENTIAL/PLATELET   ____________________________________________   ____________________________________________  RADIOLOGY  CT of the orbits is negative for any cellulitis  ____________________________________________   PROCEDURES  Procedure(s) performed: No  Procedures    ____________________________________________   INITIAL  IMPRESSION / ASSESSMENT AND PLAN / ED COURSE  Pertinent labs & imaging results that were available during my care of the patient were reviewed by me and considered in my medical decision making (see chart for details).  Patient is a 57 year old female presented emergency department complaining of eye pain.  She has been on polymyxin ophthalmic drops and oral Keflex.  She was seen by her regular doctor and by ophthalmology.  They state if she worsen that she may need IV antibiotics.  They are concerned that she could have an orbital cellulitis.  On physical exam patient's right eye is very injected.  Pain is reproduced with extraocular motions.  Both pupils are reactive.  The left eye is mildly injected but there is no pain reproduced with movement.  The patient states that she does have blurred vision when she is looking at my hand with the right eye only.  The patient was given  a saline lock.  Labs were ordered.  CBC is normal.  Met B is normal.  She was sent to CT of the orbits for cellulitis.  CT of the orbits is negative for acute cellulitis.  Plan the results to the patient.  Instructed her to discard the current eyedrops she is using.  She was given a prescription for Vigamox with prednisolone.  She is to finish her oral antibiotic.  She is to return to the emergency department if worsening.  She states she understands.  If she is not better in 2 to 3 days she should see an eye doctor or her regular doctor.  Once again she states she understands.  She was discharged in stable condition     As part of my medical decision making, I reviewed the following data within the [facility] notes reviewed and incorporated, Labs reviewed CBC and met B are normal, Radiograph reviewed CT of the orbits is normal, Notes from prior ED visits and [place] Controlled Substance Database  ____________________________________________   FINAL CLINICAL IMPRESSION(S) / ED DIAGNOSES  Final  diagnoses:  Acute bacterial conjunctivitis of right eye      NEW MEDICATIONS STARTED DURING THIS VISIT:  Discharge Medication List as of 06/25/2017  5:51 PM    START taking these medications   Details  prednisoLONE-Moxifloxacin 1-0.5 % SOLN Apply 2 drops to eye 4 (four) times daily., Starting Thu 06/25/2017, Print         Note:  This document was prepared using Dragon voice recognition software and may include unintentional dictation errors.    Versie Starks, PA-C 06/25/17 [place]    Nance Pear, MD 06/25/17 [phone]

## 2019-03-13 IMAGING — US US EXTREM UP *R* LTD
1 series · 13 of 25 positions shown · non-contrast
Comparison: MRI a right shoulder 09/05/2015, 05/17/2015

CLINICAL DATA: Right shoulder pain. Prior right shoulder surgery
most recently May 2016.

EXAM:
ULTRASOUND RIGHT UPPER EXTREMITY LIMITED
TECHNIQUE: Ultrasound examination of the upper extremity soft tissues was
performed in the area of clinical concern.

[Series 1: us extrem up *right* ltd · 0.07mm/px · 13 of 31 slices shown]
[im 1/31]
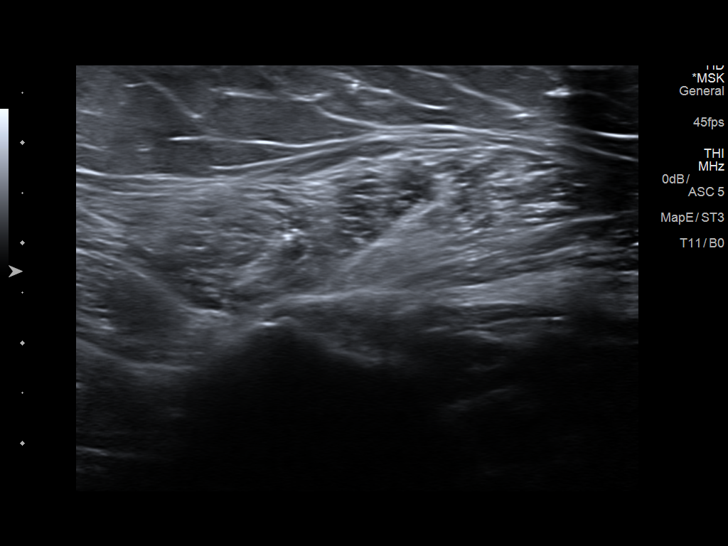
[im 3/31]
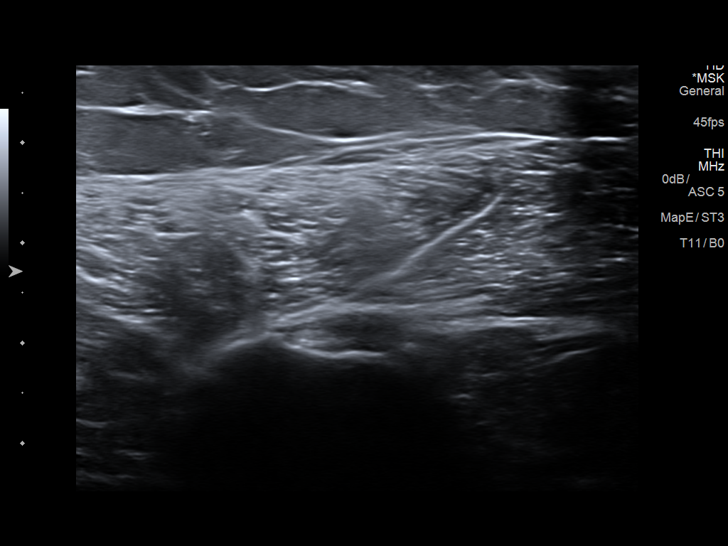
[im 6/31]
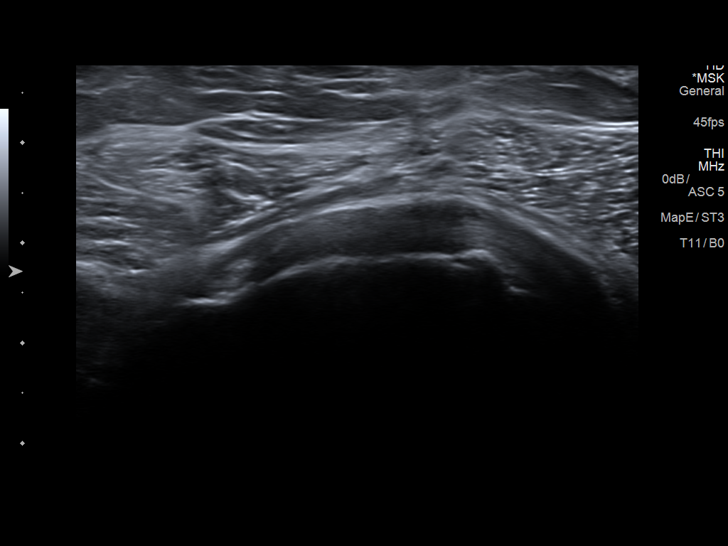
[im 8/31]
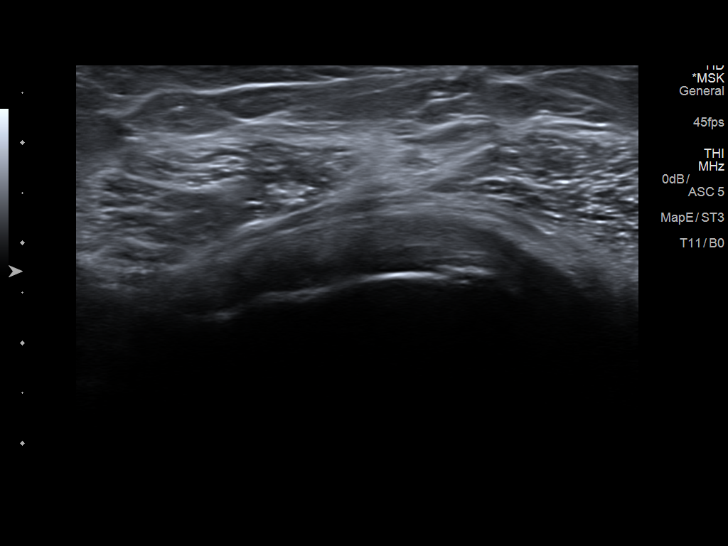
[im 11/31]
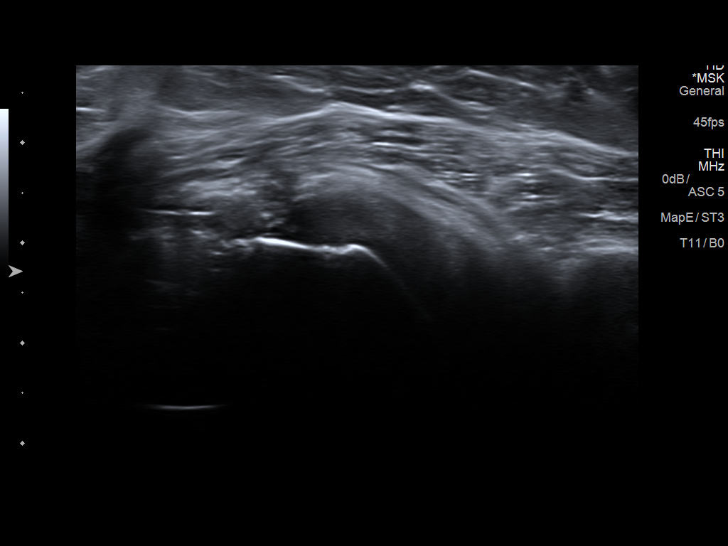
[im 13/31]
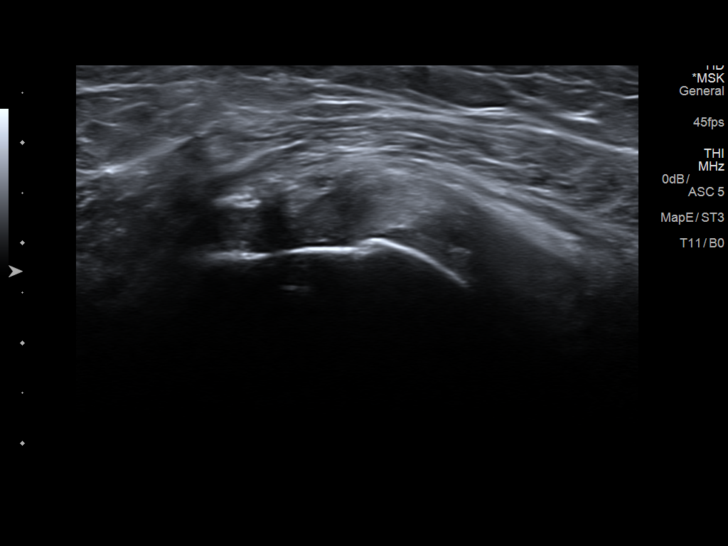
[im 16/31]
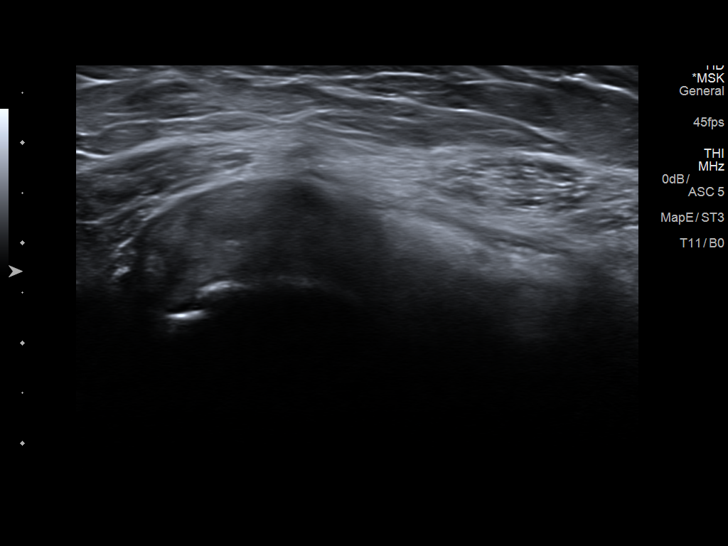
[im 18/31]
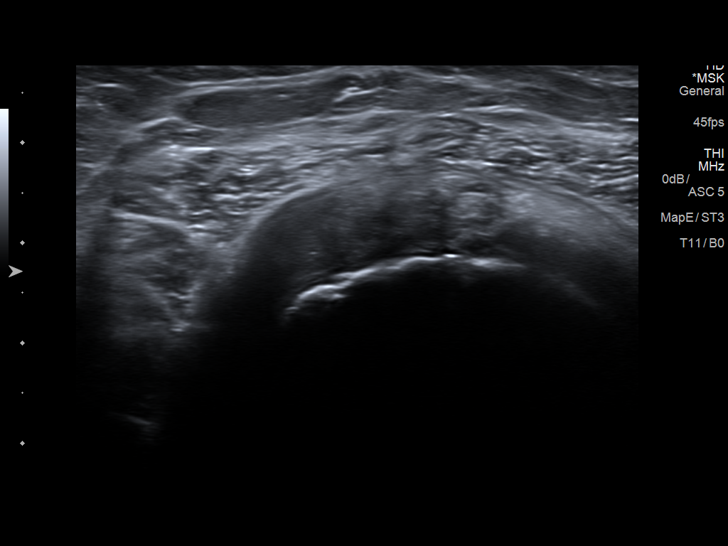
[im 21/31]
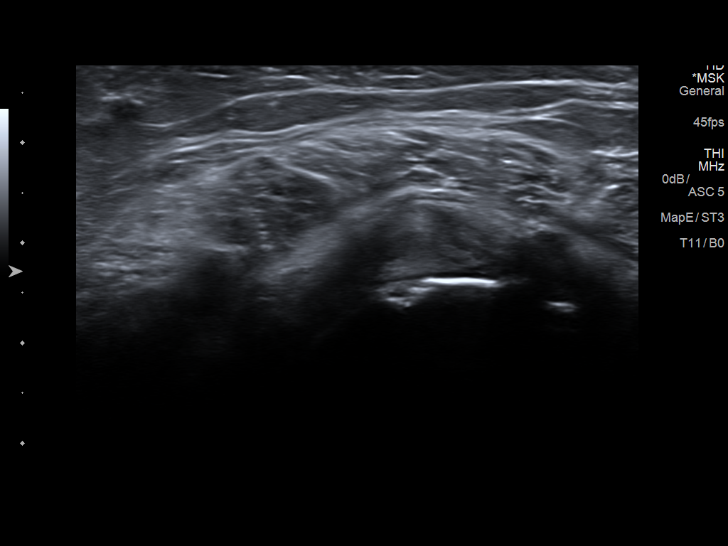
[im 23/31]
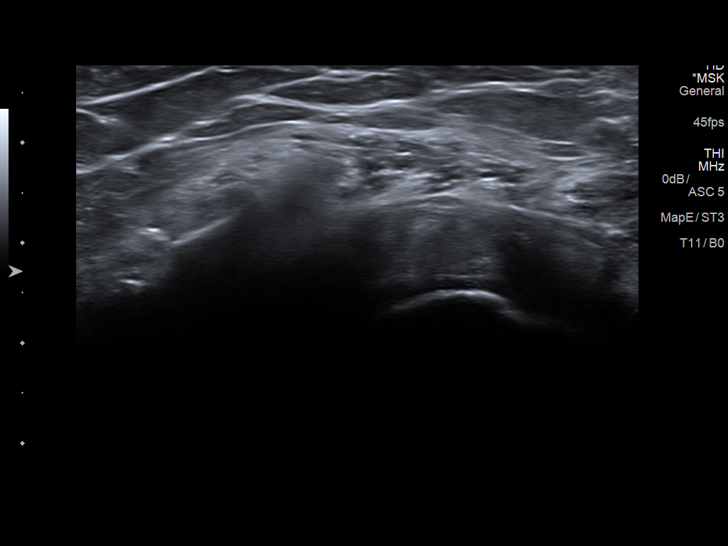
[im 26/31]
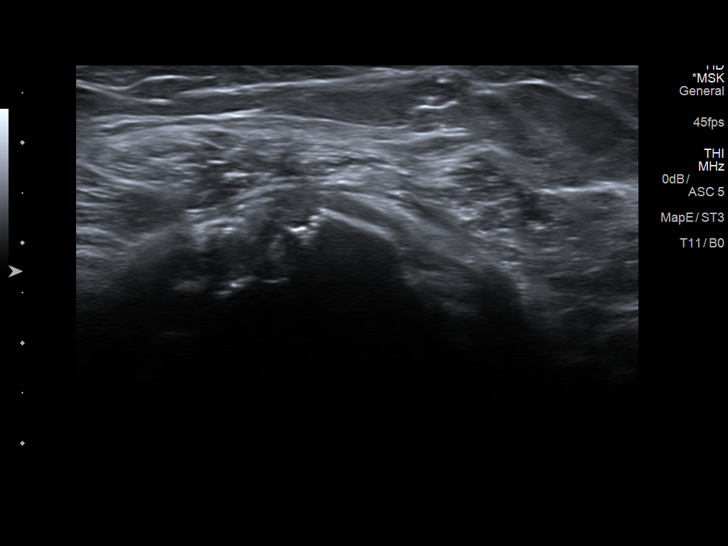
[im 28/31]
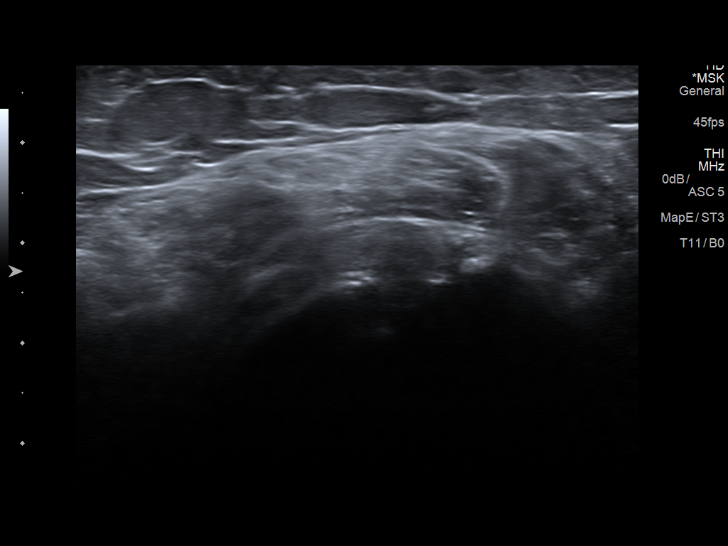
[im 31/31]
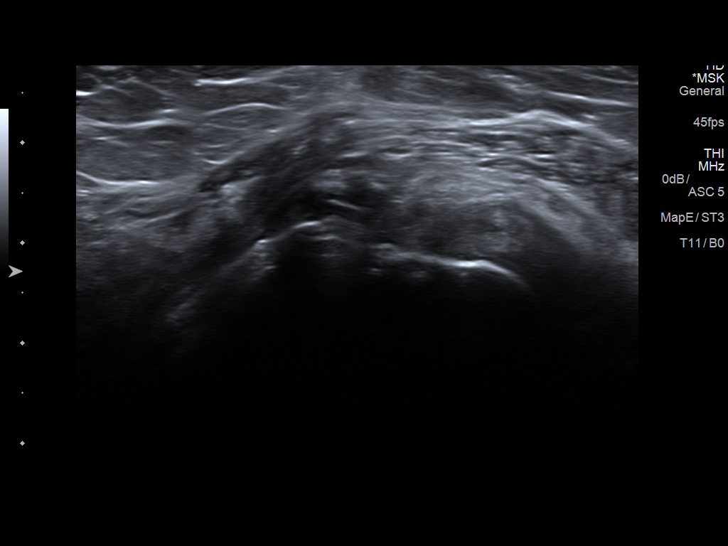

[13 of 25 positions shown; findings below may reference images not displayed]

FINDINGS: Real-time sonography of the right shoulder was performed for
evaluation of the rotator cuff.

Prior biceps tenodesis. Extra-articular portion of the long head of
the biceps tendon is intact.

Subscapularis tendon is intact.

Posterior aspect of the supraspinatus tendon is increased in signal
with thickening which may reflect severe focal tendinosis versus
postsurgical changes.

Infraspinatus tendon is mildly thickened, but otherwise intact most
consistent with mild tendinosis.

Teres minor tendon is intact.

Moderate arthropathy of the acromioclavicular joint. No
subacromial/subdeltoid bursal fluid.
IMPRESSION: 1. Posterior aspect of the supraspinatus tendon is increased in
signal with thickening which may reflect severe focal tendinosis
versus postsurgical changes.
2. Infraspinatus tendon is mildly thickened, but otherwise intact
most consistent with mild tendinosis.

## 2019-07-27 IMAGING — CT CT ORBITS W/ CM
3 series · 14 of 47 positions shown, 16 images · IV contrast (iopamidol)
Comparison: None.

CLINICAL DATA: 57 y/o F; bilateral eye irritation, conjunctivitis,
photosensitivity.

EXAM:
CT ORBITS WITH CONTRAST
TECHNIQUE: Multidetector CT images was performed according to the standard
protocol following intravenous contrast administration.
CONTRAST:  75mL XAFICG-Z55 IOPAMIDOL (XAFICG-Z55) INJECTION 61%

[Series 3: orbits 2.0 h30s st · axial · 0.32mm/px · z∈[-164,-94]mm · 8 of 41 slices shown, 10 images]
[im 3/41  brain]
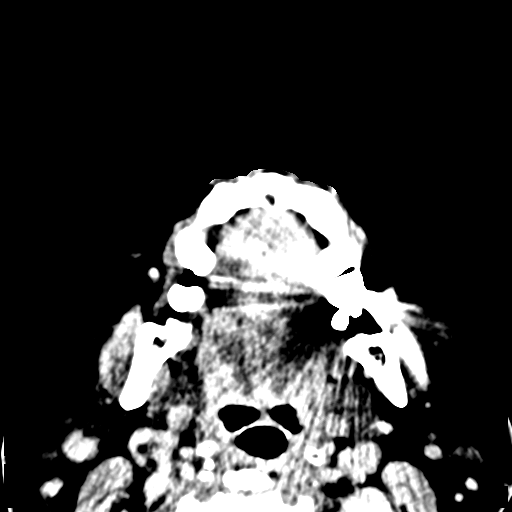
[im 3/41  bone]
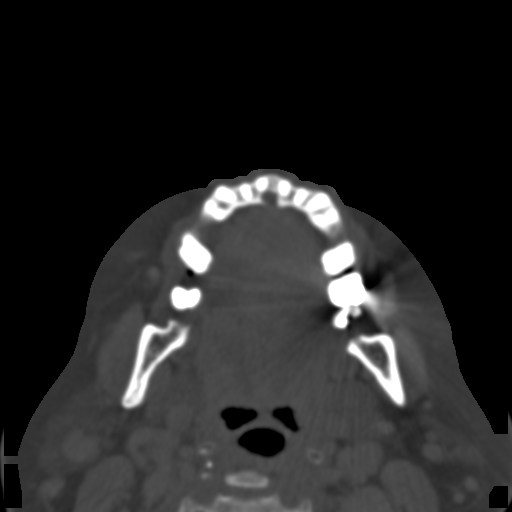
[im 9/41  bone]
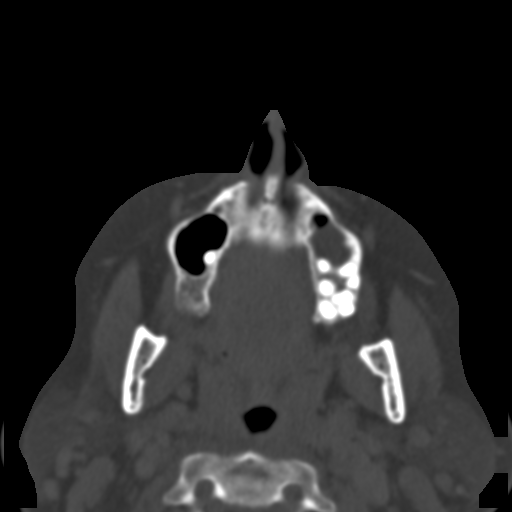
[im 13/41  bone]
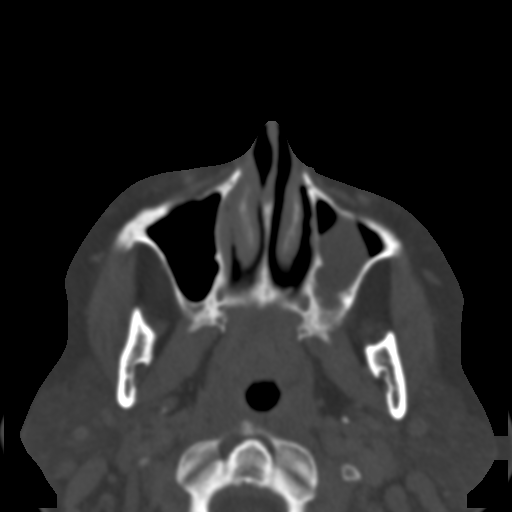
[im 18/41  bone]
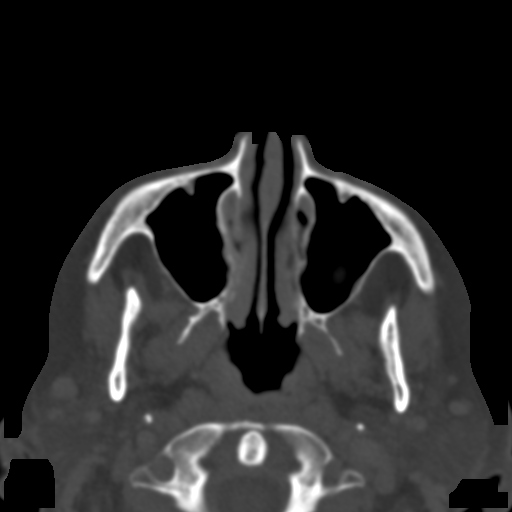
[im 23/41  brain]
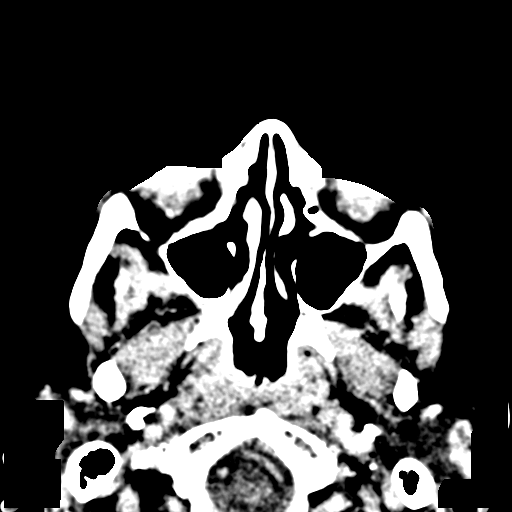
[im 23/41  bone]
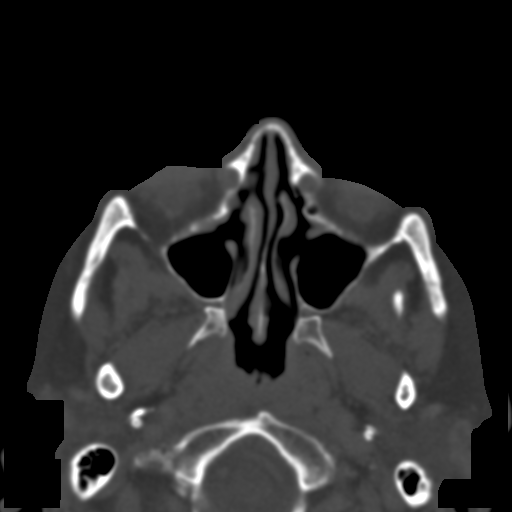
[im 28/41  bone]
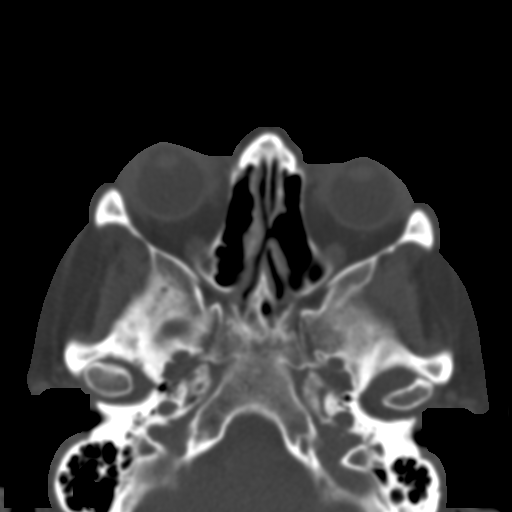
[im 32/41  bone]
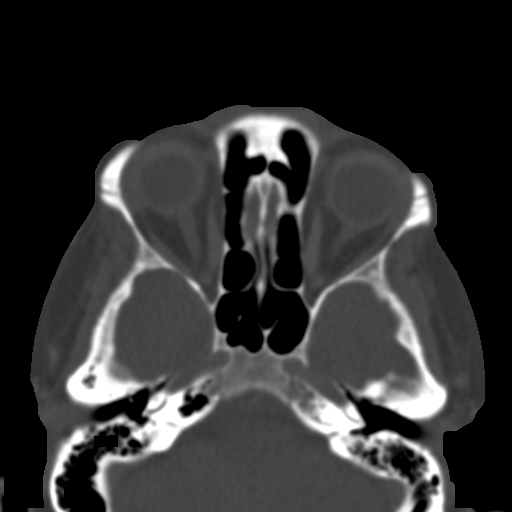
[im 38/41  bone]
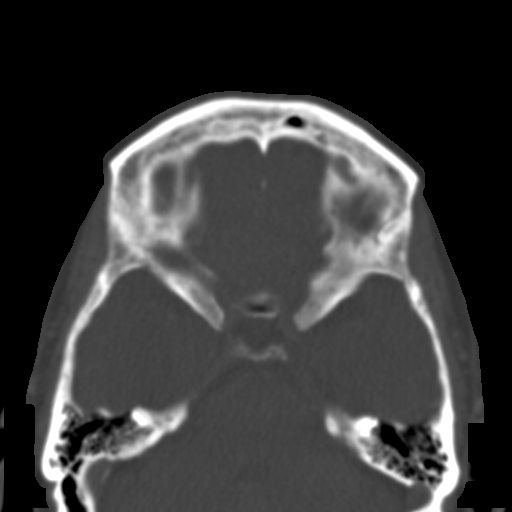

[Series 8: orbits 2.0 coronal · coronal · 0.17mm/px · 3 of 58 slices shown]
[im 20/58  bone]
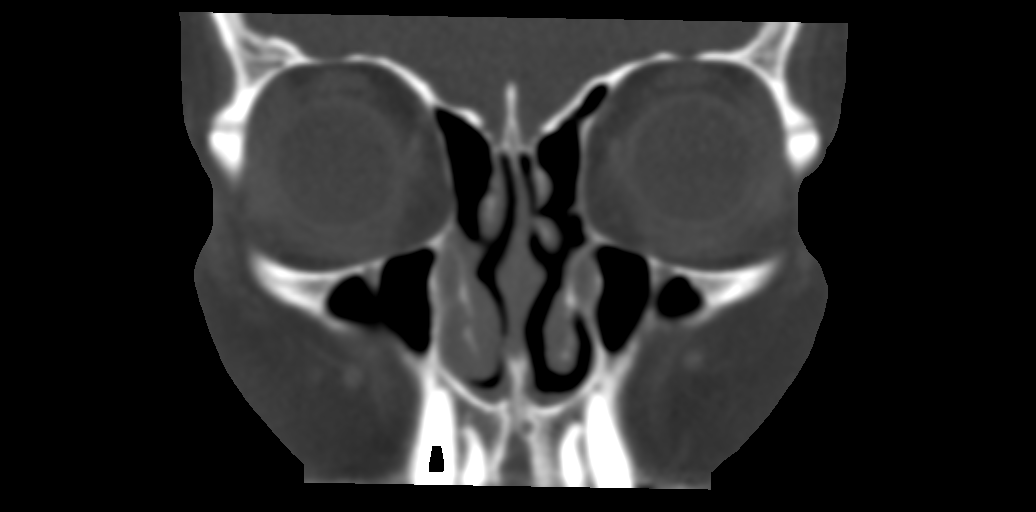
[im 26/58  bone]
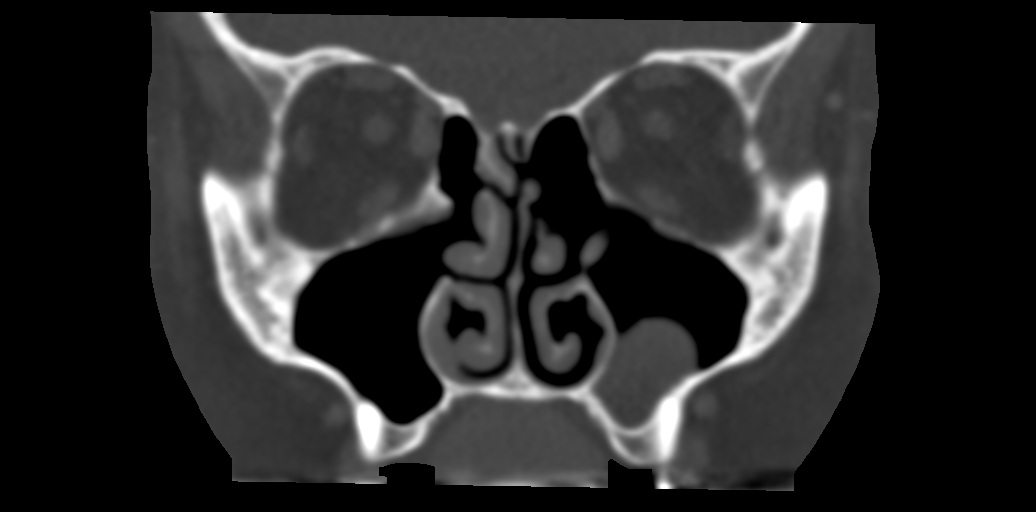
[im 32/58  bone]
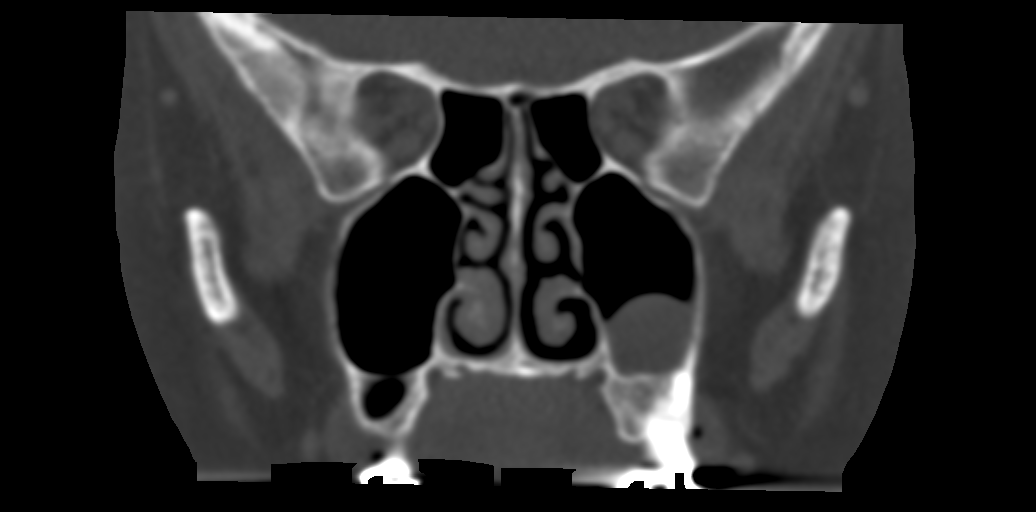

[Series 9: orbits 2.0 sagittal · sagittal · 0.20mm/px · 3 of 90 slices shown]
[im 30/90  bone]
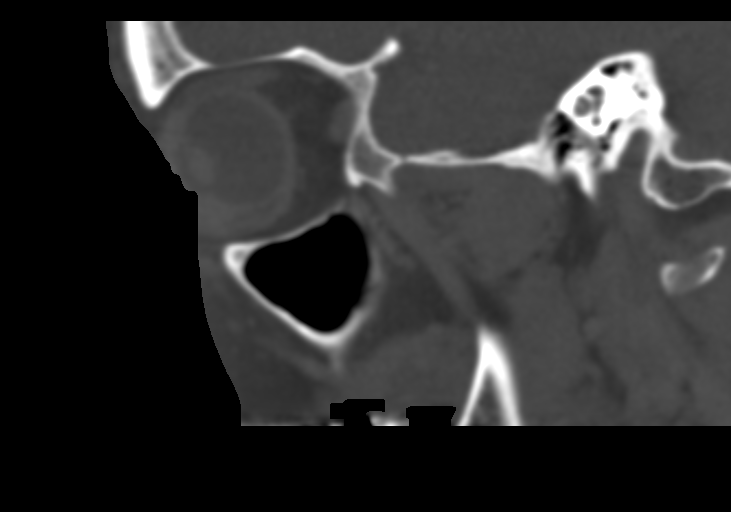
[im 45/90  bone]
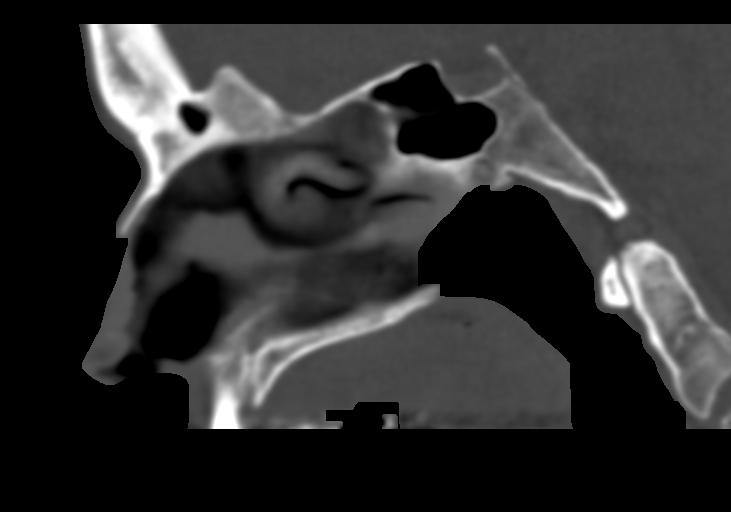
[im 60/90  bone]
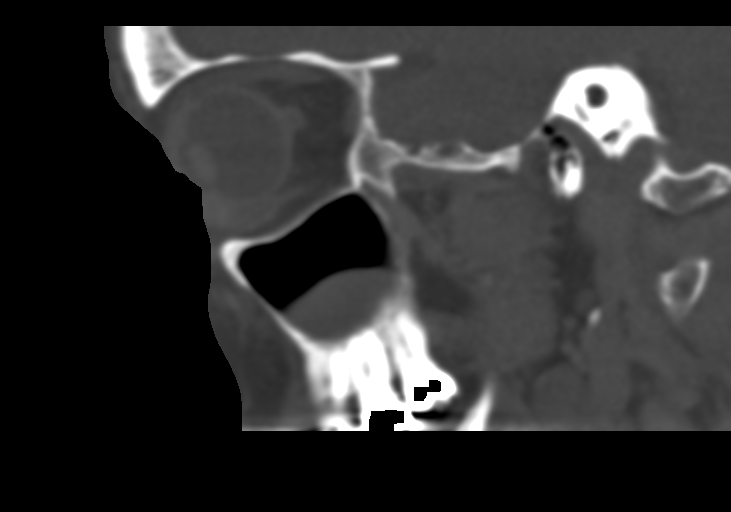

[14 of 47 positions shown; findings below may reference images not displayed]

FINDINGS: Orbits: No traumatic or inflammatory finding. Globes, optic nerves,
orbital fat, extraocular muscles, vascular structures, and lacrimal
glands are normal.

Visualized sinuses: Large left maxillary sinus mucous retention
cyst. Postsurgical changes with bilateral maxillary antrostomy and
ethmoidectomy. Normal aeration of mastoid air cells.

Soft tissues: Negative.

Limited intracranial: No significant or unexpected finding.
IMPRESSION: Normal orbital mass or inflammatory change identified.

By: Liven Madatt M.D.

## 2019-10-19 ENCOUNTER — Other Ambulatory Visit: Payer: Medicaid Other

## 2019-11-05 ENCOUNTER — Other Ambulatory Visit: Payer: Self-pay

## 2019-11-05 ENCOUNTER — Encounter: Payer: Self-pay | Admitting: Intensive Care

## 2019-11-05 ENCOUNTER — Emergency Department: Payer: Medicaid Other

## 2019-11-05 ENCOUNTER — Emergency Department
Admission: EM | Admit: 2019-11-05 | Discharge: 2019-11-05 | Disposition: A | Discharge status: Home / Self Care | Payer: Medicaid Other | Attending: Emergency Medicine | Admitting: Emergency Medicine

## 2019-11-05 DIAGNOSIS — R05 Cough: Secondary | ICD-10-CM | POA: Insufficient documentation

## 2019-11-05 DIAGNOSIS — Z5321 Procedure and treatment not carried out due to patient leaving prior to being seen by health care provider: Secondary | ICD-10-CM | POA: Diagnosis not present

## 2019-11-05 LAB — CBC
HCT: 37.7 % (ref 36.0–46.0)
Hemoglobin: 11.7 g/dL — ABNORMAL LOW (ref 12.0–15.0)
MCH: 27.8 pg (ref 26.0–34.0)
MCHC: 31 g/dL (ref 30.0–36.0)
MCV: 89.5 fL (ref 80.0–100.0)
Platelets: 269 10*3/uL (ref 150–400)
RBC: 4.21 MIL/uL (ref 3.87–5.11)
RDW: 15.5 % (ref 11.5–15.5)
WBC: 2.7 10*3/uL — ABNORMAL LOW (ref 4.0–10.5)
nRBC: 0 % (ref 0.0–0.2)

## 2019-11-05 LAB — BASIC METABOLIC PANEL
Anion gap: 9 (ref 5–15)
BUN: 10 mg/dL (ref 6–20)
CO2: 23 mmol/L (ref 22–32)
Calcium: 8.1 mg/dL — ABNORMAL LOW (ref 8.9–10.3)
Chloride: 103 mmol/L (ref 98–111)
Creatinine, Ser: 0.8 mg/dL (ref 0.44–1.00)
GFR calc Af Amer: >60 mL/min (ref >60)
GFR calc non Af Amer: >60 mL/min (ref >60)
Glucose, Bld: 99 mg/dL (ref 70–99)
Potassium: 3.7 mmol/L (ref 3.5–5.1)
Sodium: 135 mmol/L (ref 135–145)

## 2019-11-05 LAB — TROPONIN I (HIGH SENSITIVITY): Troponin I (High Sensitivity): 4 ng/L (ref <18)

## 2019-11-05 MED ORDER — ACETAMINOPHEN 500 MG PO TABS
1000.0000 mg | ORAL_TABLET | Freq: Once | ORAL | Status: AC
  Administered 2019-11-05: 1000 mg via ORAL
  Filled 2019-11-05: qty 2

## 2019-11-05 NOTE — ED Triage Notes (Signed)
Pt c/o chest pain, back pain, migraine with no relief, bilateral leg pain, and cough since wednesday

## 2019-11-09 ENCOUNTER — Ambulatory Visit (HOSPITAL_COMMUNITY): Payer: Medicaid Other

## 2019-11-09 ENCOUNTER — Other Ambulatory Visit: Payer: Self-pay | Admitting: Oncology

## 2019-11-09 ENCOUNTER — Ambulatory Visit (HOSPITAL_COMMUNITY)
Admission: RE | Admit: 2019-11-09 | Discharge: 2019-11-09 | Disposition: A | Discharge status: Home / Self Care | Payer: Medicaid Other | Source: Ambulatory Visit | Attending: Pulmonary Disease | Admitting: Pulmonary Disease

## 2019-11-09 DIAGNOSIS — U071 COVID-19: Secondary | ICD-10-CM | POA: Insufficient documentation

## 2019-11-09 MED ORDER — ALBUTEROL SULFATE HFA 108 (90 BASE) MCG/ACT IN AERS: 2.0000 | INHALATION_SPRAY | Freq: Once | RESPIRATORY_TRACT | Status: DC | PRN

## 2019-11-09 MED ORDER — SODIUM CHLORIDE 0.9 % IV SOLN
1200.0000 mg | Freq: Once | INTRAVENOUS | Status: AC
  Administered 2019-11-09: 1200 mg via INTRAVENOUS
  Filled 2019-11-09: qty 10

## 2019-11-09 MED ORDER — EPINEPHRINE 0.3 MG/0.3ML IJ SOAJ: 0.3000 mg | Freq: Once | INTRAMUSCULAR | Status: DC | PRN

## 2019-11-09 MED ORDER — SODIUM CHLORIDE 0.9 % IV SOLN: INTRAVENOUS | Status: DC | PRN

## 2019-11-09 MED ORDER — ACETAMINOPHEN 325 MG PO TABS
650.0000 mg | ORAL_TABLET | Freq: Once | ORAL | Status: AC
  Administered 2019-11-09: 650 mg via ORAL
  Filled 2019-11-09: qty 2

## 2019-11-09 MED ORDER — FAMOTIDINE IN NACL 20-0.9 MG/50ML-% IV SOLN: 20.0000 mg | Freq: Once | INTRAVENOUS | Status: DC | PRN

## 2019-11-09 MED ORDER — METHYLPREDNISOLONE SODIUM SUCC 125 MG IJ SOLR: 125.0000 mg | Freq: Once | INTRAMUSCULAR | Status: DC | PRN

## 2019-11-09 MED ORDER — DIPHENHYDRAMINE HCL 50 MG/ML IJ SOLN: 50.0000 mg | Freq: Once | INTRAMUSCULAR | Status: DC | PRN

## 2019-11-09 NOTE — Discharge Instructions (Signed)

## 2019-11-09 NOTE — Progress Notes (Signed)
I connected by phone with  Sydney Collins to discuss the potential use of an new treatment for mild to moderate COVID-19 viral infection in non-hospitalized patients.   This patient is a age/sex that meets the FDA criteria for Emergency Use Authorization of casirivimab\imdevimab.  Has a (+) direct SARS-CoV-2 viral test result 1. Has mild or moderate COVID-19  2. Is ? 58 years of age and weighs ? 40 kg 3. Is NOT hospitalized due to COVID-19 4. Is NOT requiring oxygen therapy or requiring an increase in baseline oxygen flow rate due to COVID-19 5. Is within 10 days of symptom onset 6. Has at least one of the high risk factor(s) for progression to severe COVID-19 and/or hospitalization as defined in EUA. ? Specific high risk criteria :Obesity    Symptom onset  11/02/2019   I have spoken and communicated the following to the patient or parent/caregiver:   1. FDA has authorized the emergency use of casirivimab\imdevimab for the treatment of mild to moderate COVID-19 in adults and pediatric patients with positive results of direct SARS-CoV-2 viral testing who are 15 years of age and older weighing at least 40 kg, and who are at high risk for progressing to severe COVID-19 and/or hospitalization.   2. The significant known and potential risks and benefits of casirivimab\imdevimab, and the extent to which such potential risks and benefits are unknown.   3. Information on available alternative treatments and the risks and benefits of those alternatives, including clinical trials.   4. Patients treated with casirivimab\imdevimab should continue to self-isolate and use infection control measures (e.g., wear mask, isolate, social distance, avoid sharing personal items, clean and disinfect "high touch" surfaces, and frequent handwashing) according to CDC guidelines.    5. The patient or parent/caregiver has the option to accept or refuse casirivimab\imdevimab .   After reviewing this information with the  patient, The patient agreed to proceed with receiving casirivimab\imdevimab infusion and will be provided a copy of the Fact sheet prior to receiving the infusion.Mignon Pine, AGNP-C [phone] (Infusion Center Hotline)

## 2019-11-09 NOTE — Progress Notes (Signed)
  Diagnosis: COVID-19  Physician: Dr. Wright  Procedure: [facility] Med: casirivimab\imdevimab infusion - Provided patient with casirivimab\imdevimab fact sheet for patients, parents and caregivers prior to infusion.  Complications: No immediate complications noted.  Discharge: Discharged home   Sydney Collins L 11/09/2019   

## 2019-11-10 ENCOUNTER — Ambulatory Visit (HOSPITAL_COMMUNITY): Payer: Medicaid Other

## 2019-11-23 ENCOUNTER — Other Ambulatory Visit: Payer: Self-pay | Admitting: Family Medicine

## 2019-11-23 DIAGNOSIS — Z1231 Encounter for screening mammogram for malignant neoplasm of breast: Secondary | ICD-10-CM

## 2022-03-21 ENCOUNTER — Other Ambulatory Visit: Payer: Self-pay | Admitting: Nurse Practitioner

## 2022-03-21 DIAGNOSIS — Z1231 Encounter for screening mammogram for malignant neoplasm of breast: Secondary | ICD-10-CM

## 2023-04-09 ENCOUNTER — Other Ambulatory Visit: Payer: Self-pay

## 2023-04-09 ENCOUNTER — Inpatient Hospital Stay
Admission: EM | Admit: 2023-04-09 | Discharge: 2023-04-12 | DRG: 811 | Disposition: A | Discharge status: Home / Self Care | Payer: Medicaid Other | Attending: Internal Medicine | Admitting: Internal Medicine

## 2023-04-09 ENCOUNTER — Emergency Department: Payer: Medicaid Other

## 2023-04-09 DIAGNOSIS — R1011 Right upper quadrant pain: Secondary | ICD-10-CM | POA: Diagnosis present

## 2023-04-09 DIAGNOSIS — Z791 Long term (current) use of non-steroidal anti-inflammatories (NSAID): Secondary | ICD-10-CM

## 2023-04-09 DIAGNOSIS — K219 Gastro-esophageal reflux disease without esophagitis: Secondary | ICD-10-CM | POA: Diagnosis present

## 2023-04-09 DIAGNOSIS — R0902 Hypoxemia: Secondary | ICD-10-CM | POA: Diagnosis present

## 2023-04-09 DIAGNOSIS — G4733 Obstructive sleep apnea (adult) (pediatric): Secondary | ICD-10-CM | POA: Diagnosis present

## 2023-04-09 DIAGNOSIS — G8929 Other chronic pain: Secondary | ICD-10-CM | POA: Diagnosis present

## 2023-04-09 DIAGNOSIS — D509 Iron deficiency anemia, unspecified: Secondary | ICD-10-CM | POA: Diagnosis present

## 2023-04-09 DIAGNOSIS — I110 Hypertensive heart disease with heart failure: Secondary | ICD-10-CM | POA: Diagnosis present

## 2023-04-09 DIAGNOSIS — Z91040 Latex allergy status: Secondary | ICD-10-CM | POA: Diagnosis not present

## 2023-04-09 DIAGNOSIS — R0789 Other chest pain: Secondary | ICD-10-CM | POA: Diagnosis present

## 2023-04-09 DIAGNOSIS — K0889 Other specified disorders of teeth and supporting structures: Secondary | ICD-10-CM | POA: Diagnosis present

## 2023-04-09 DIAGNOSIS — Z79899 Other long term (current) drug therapy: Secondary | ICD-10-CM

## 2023-04-09 DIAGNOSIS — D649 Anemia, unspecified: Secondary | ICD-10-CM | POA: Diagnosis present

## 2023-04-09 DIAGNOSIS — I509 Heart failure, unspecified: Principal | ICD-10-CM

## 2023-04-09 DIAGNOSIS — K449 Diaphragmatic hernia without obstruction or gangrene: Secondary | ICD-10-CM | POA: Diagnosis present

## 2023-04-09 DIAGNOSIS — D508 Other iron deficiency anemias: Secondary | ICD-10-CM | POA: Diagnosis not present

## 2023-04-09 DIAGNOSIS — R0602 Shortness of breath: Secondary | ICD-10-CM | POA: Diagnosis present

## 2023-04-09 DIAGNOSIS — Z8616 Personal history of COVID-19: Secondary | ICD-10-CM

## 2023-04-09 DIAGNOSIS — I5031 Acute diastolic (congestive) heart failure: Secondary | ICD-10-CM | POA: Diagnosis present

## 2023-04-09 DIAGNOSIS — Z6841 Body Mass Index (BMI) 40.0 and over, adult: Secondary | ICD-10-CM

## 2023-04-09 DIAGNOSIS — Z823 Family history of stroke: Secondary | ICD-10-CM | POA: Diagnosis not present

## 2023-04-09 DIAGNOSIS — Z8249 Family history of ischemic heart disease and other diseases of the circulatory system: Secondary | ICD-10-CM | POA: Diagnosis not present

## 2023-04-09 LAB — CBC
HCT: 18 % — ABNORMAL LOW (ref 36.0–46.0)
Hemoglobin: 4.3 g/dL — CL (ref 12.0–15.0)
MCH: 17.1 pg — ABNORMAL LOW (ref 26.0–34.0)
MCHC: 23.9 g/dL — ABNORMAL LOW (ref 30.0–36.0)
MCV: 71.4 fL — ABNORMAL LOW (ref 80.0–100.0)
Platelets: 560 10*3/uL — ABNORMAL HIGH (ref 150–400)
RBC: 2.52 MIL/uL — ABNORMAL LOW (ref 3.87–5.11)
RDW: 19.9 % — ABNORMAL HIGH (ref 11.5–15.5)
WBC: 7.8 10*3/uL (ref 4.0–10.5)
nRBC: 0.6 % — ABNORMAL HIGH (ref 0.0–0.2)

## 2023-04-09 LAB — HEPATIC FUNCTION PANEL
ALT: 23 U/L (ref 0–44)
AST: 24 U/L (ref 15–41)
Albumin: 3.8 g/dL (ref 3.5–5.0)
Alkaline Phosphatase: 56 U/L (ref 38–126)
Bilirubin, Direct: <0.1 mg/dL (ref 0.0–0.2)
Total Bilirubin: 0.6 mg/dL (ref 0.0–1.2)
Total Protein: 7.1 g/dL (ref 6.5–8.1)

## 2023-04-09 LAB — BASIC METABOLIC PANEL
Anion gap: 14 (ref 5–15)
BUN: 18 mg/dL (ref 8–23)
CO2: 18 mmol/L — ABNORMAL LOW (ref 22–32)
Calcium: 8.9 mg/dL (ref 8.9–10.3)
Chloride: 104 mmol/L (ref 98–111)
Creatinine, Ser: 0.54 mg/dL (ref 0.44–1.00)
GFR, Estimated: >60 mL/min (ref >60)
Glucose, Bld: 129 mg/dL — ABNORMAL HIGH (ref 70–99)
Potassium: 4.1 mmol/L (ref 3.5–5.1)
Sodium: 136 mmol/L (ref 135–145)

## 2023-04-09 LAB — PROTIME-INR
INR: 1.2 (ref 0.8–1.2)
Prothrombin Time: 15 s (ref 11.4–15.2)

## 2023-04-09 LAB — RETICULOCYTES
Immature Retic Fract: 30.5 % — ABNORMAL HIGH (ref 2.3–15.9)
RBC.: 2.53 MIL/uL — ABNORMAL LOW (ref 3.87–5.11)
Retic Count, Absolute: 95.6 10*3/uL (ref 19.0–186.0)
Retic Ct Pct: 3.8 % — ABNORMAL HIGH (ref 0.4–3.1)

## 2023-04-09 LAB — BRAIN NATRIURETIC PEPTIDE: B Natriuretic Peptide: 500.2 pg/mL — ABNORMAL HIGH (ref 0.0–100.0)

## 2023-04-09 LAB — ABO/RH: ABO/RH(D): A POS

## 2023-04-09 LAB — TROPONIN I (HIGH SENSITIVITY)
Troponin I (High Sensitivity): 6 ng/L (ref <18)
Troponin I (High Sensitivity): 7 ng/L (ref <18)

## 2023-04-09 LAB — APTT: aPTT: 27 s (ref 24–36)

## 2023-04-09 LAB — PREPARE RBC (CROSSMATCH)

## 2023-04-09 MED ORDER — ACETAMINOPHEN 325 MG PO TABS
650.0000 mg | ORAL_TABLET | Freq: Four times a day (QID) | ORAL | Status: DC | PRN
  Administered 2023-04-12: 650 mg via ORAL
  Filled 2023-04-09 (×2): qty 2

## 2023-04-09 MED ORDER — HYDROMORPHONE HCL 2 MG PO TABS
2.0000 mg | ORAL_TABLET | ORAL | Status: DC | PRN
  Administered 2023-04-11 – 2023-04-12 (×2): 2 mg via ORAL
  Filled 2023-04-09 (×2): qty 1

## 2023-04-09 MED ORDER — TRAZODONE HCL 50 MG PO TABS
25.0000 mg | ORAL_TABLET | Freq: Every evening | ORAL | Status: DC | PRN
  Administered 2023-04-10 – 2023-04-11 (×3): 25 mg via ORAL
  Filled 2023-04-09 (×3): qty 1

## 2023-04-09 MED ORDER — SODIUM CHLORIDE 0.9 % IV SOLN
10.0000 mL/h | Freq: Once | INTRAVENOUS | Status: AC
  Administered 2023-04-09: 10 mL/h via INTRAVENOUS

## 2023-04-09 MED ORDER — ACETAMINOPHEN 650 MG RE SUPP: 650.0000 mg | Freq: Four times a day (QID) | RECTAL | Status: DC | PRN

## 2023-04-09 MED ORDER — MAGNESIUM HYDROXIDE 400 MG/5ML PO SUSP
30.0000 mL | Freq: Every day | ORAL | Status: DC | PRN
  Administered 2023-04-11: 30 mL via ORAL
  Filled 2023-04-09: qty 30

## 2023-04-09 MED ORDER — MELATONIN 5 MG PO TABS
5.0000 mg | ORAL_TABLET | Freq: Every day | ORAL | Status: DC
  Administered 2023-04-09 – 2023-04-11 (×3): 5 mg via ORAL
  Filled 2023-04-09 (×3): qty 1

## 2023-04-09 MED ORDER — FUROSEMIDE 10 MG/ML IJ SOLN
20.0000 mg | Freq: Two times a day (BID) | INTRAMUSCULAR | Status: DC
  Administered 2023-04-10 – 2023-04-12 (×5): 20 mg via INTRAVENOUS
  Filled 2023-04-09: qty 4
  Filled 2023-04-09 (×4): qty 2

## 2023-04-09 MED ORDER — FUROSEMIDE 10 MG/ML IJ SOLN
20.0000 mg | Freq: Once | INTRAMUSCULAR | Status: AC
  Administered 2023-04-09: 20 mg via INTRAVENOUS
  Filled 2023-04-09: qty 4

## 2023-04-09 MED ORDER — ONDANSETRON HCL 4 MG/2ML IJ SOLN: 4.0000 mg | Freq: Four times a day (QID) | INTRAMUSCULAR | Status: DC | PRN

## 2023-04-09 MED ORDER — ONDANSETRON HCL 4 MG PO TABS: 4.0000 mg | ORAL_TABLET | Freq: Four times a day (QID) | ORAL | Status: DC | PRN

## 2023-04-09 NOTE — ED Provider Notes (Signed)
 [facility] Provider Note    Event Date/Time   First MD Initiated Contact with Patient 04/09/23 1923     (approximate)   History   Shortness of Breath   HPI  Sydney Collins is a 63 y.o. female who presents to the ED for evaluation of Shortness of Breath   Most recent comparison CBC from 2021, hemoglobin 11.2.  Seen in the ED for COVID-19.  Otherwise only has urgent care visits and no other outpatient visits.   Patient presents to the ED due to chronic and progressive symptoms.  Reports that she does not take any regular prescription medications.  Not a cigarette smoker.  Does have a family history of heart disease and CHF.  Occasional cannabis use, no cocaine or methamphetamine use.  Works at [employer].  Does have a PCP, but has not seen for a while.  She reports worsening of progressive shortness of breath, orthopnea, presyncopal dizziness, chest pain on exertion.  No syncope.  Denies symptoms of blood loss such as melena or hematochezia.  Had a nosebleed months a couple weeks ago.   Physical Exam   Triage Vital Signs: ED Triage Vitals  Encounter Vitals Group     BP 04/09/23 1819 (!) 158/89     Systolic BP Percentile --      Diastolic BP Percentile --      Pulse Rate 04/09/23 1819 (!) 116     Resp 04/09/23 1819 20     Temp 04/09/23 1819 98 F (36.7 C)     Temp Source 04/09/23 1819 Oral     SpO2 04/09/23 1815 (!) 86 %     Weight 04/09/23 1819 246 lb (111.6 kg)     Height --      Head Circumference --      Peak Flow --      Pain Score 04/09/23 1819 2     Pain Loc --      Pain Education --      Exclude from Growth Chart --     Most recent vital signs: Vitals:   04/09/23 1815 04/09/23 1819  BP:  (!) 158/89  Pulse:  (!) 116  Resp:  20  Temp:  98 F (36.7 C)  SpO2: (!) 86% 96%    General: Awake, no distress.  Appears pale, pleasant and conversational. CV:  Good peripheral perfusion.  Resp:  Normal effort.  Abd:  No  distention.  Soft MSK:  No deformity noted.  Mildly edematous bilateral lower extremities Neuro:  No focal deficits appreciated. Other:     ED Results / Procedures / Treatments   Labs (all labs ordered are listed, but only abnormal results are displayed) Labs Reviewed  BASIC METABOLIC PANEL - Abnormal; Notable for the following components:      Result Value   CO2 18 (*)    Glucose, Bld 129 (*)    All other components within normal limits  CBC - Abnormal; Notable for the following components:   RBC 2.52 (*)    Hemoglobin 4.3 (*)    HCT 18.0 (*)    MCV 71.4 (*)    MCH 17.1 (*)    MCHC 23.9 (*)    RDW 19.9 (*)    Platelets 560 (*)    nRBC 0.6 (*)    All other components within normal limits  BRAIN NATRIURETIC PEPTIDE  PROTIME-INR  APTT  HEPATIC FUNCTION PANEL  TYPE AND SCREEN  PREPARE RBC (CROSSMATCH)  TROPONIN  I (HIGH SENSITIVITY)    EKG Sinus tachycardia with a rate of 105 bpm.  Normal axis and intervals.  No clear signs of acute ischemia.  RADIOLOGY 2 view CXR interpreted by me with cardiomegaly and pulmonary vascular congestion without discrete lobar filtration.  Official radiology report(s): DG Chest 2 View Result Date: 04/09/2023 CLINICAL DATA:  Shortness of breath for 1 month. 20 pound weight gain over 6 weeks. Decreased oxygen saturation. EXAM: CHEST - 2 VIEW COMPARISON:  11/05/2019 FINDINGS: Cardiac enlargement. Mild central vascular congestion. Slight interstitial change in the left base may represent early edema. This is progressing since prior study. Small bilateral pleural effusions. No pneumothorax. Esophageal hiatal hernia behind the heart. IMPRESSION: Cardiac enlargement with mild vascular congestion, small pleural effusions, and possible early interstitial edema. Moderate-sized esophageal hiatal hernia behind the heart. Electronically Signed   By: Elsie Gravely M.D.   On: 04/09/2023 18:55    PROCEDURES and INTERVENTIONS:  .1-3 Lead EKG  Interpretation  Performed by: Claudene Rover, MD Authorized by: Claudene Rover, MD     Interpretation: abnormal     ECG rate:  106   ECG rate assessment: tachycardic     Rhythm: sinus tachycardia     Ectopy: none     Conduction: normal   .Critical Care  Performed by: Claudene Rover, MD Authorized by: Claudene Rover, MD   Critical care provider statement:    Critical care time (minutes):  30   Critical care time was exclusive of:  Separately billable procedures and treating other patients   Critical care was necessary to treat or prevent imminent or life-threatening deterioration of the following conditions:  Respiratory failure and circulatory failure   Critical care was time spent personally by me on the following activities:  Development of treatment plan with patient or surrogate, discussions with consultants, evaluation of patient's response to treatment, examination of patient, ordering and review of laboratory studies, ordering and review of radiographic studies, ordering and performing treatments and interventions, pulse oximetry, re-evaluation of patient's condition and review of old charts   Medications  0.9 %  sodium chloride  infusion (has no administration in time range)     IMPRESSION / MDM / ASSESSMENT AND PLAN / ED COURSE  I reviewed the triage vital signs and the nursing notes.  Differential diagnosis includes, but is not limited to, ACS, PTX, PNA, muscle strain/spasm, PE, dissection, anxiety, pleural effusion, GI bleeding, consumptive process such as TTP, renal failure  {Patient presents with symptoms of an acute illness or injury that is potentially life-threatening.  Patient without much medical history presents with progressive chronic symptoms with signs of symptomatic anemia of uncertain etiology as well as CHF requiring medical admission.  She is hypoxic requiring nasal cannula, tachycardic but hemodynamically stable.  Hemoglobin of 4.3, microcytic.  Normal WBC,  platelets slightly elevated.  Essentially normal metabolic panel.  First troponin is negative.  We will add BNP to screen for CHF.  Coagulations and type and screen as she will need transfusion of multiple units of PRBCs.  I recommend medical admission and she is agreeable.      FINAL CLINICAL IMPRESSION(S) / ED DIAGNOSES   Final diagnoses:  Congestive heart failure, unspecified HF chronicity, unspecified heart failure type (HCC)  Symptomatic anemia     Rx / DC Orders   ED Discharge Orders     None        Note:  This document was prepared using Dragon voice recognition software and may include unintentional dictation errors.  Claudene Rover, MD 04/09/23 [phone]

## 2023-04-09 NOTE — H&P (Addendum)
 [place]   PATIENT NAME: Sydney Collins    MR#:  [id number]  DATE OF BIRTH:  1960-11-18  DATE OF ADMISSION:  04/09/2023  PRIMARY CARE PHYSICIAN: [facility]   Patient is coming from: Home  REQUESTING/REFERRING PHYSICIAN: Claudene Rover, MD  CHIEF COMPLAINT:   Chief Complaint  Patient presents with  . Shortness of Breath    HISTORY OF PRESENT ILLNESS:  Sydney Collins is a 63 y.o. Caucasian female with medical history significant for essential hypertension, GERD, osteoarthritis and OSA, who presented to the emergency room with acute onset of worsening dyspnea with associated fatigue and tiredness since December.  He admitted to dry cough and wheezing and has been having worsening lower extremity edema as well as orthopnea, paroxysmal nocturnal dyspnea and dyspnea on exertion.  She also noted that she was pale.  She denied any melena or bright red blood per rectum.  No nausea or vomiting or abdominal pain or heartburn.  No dysuria, oliguria or hematuria or flank pain.  No other bleeding diathesis.  The patient gained 20 pounds over the last 6 weeks.  ED Course: When the patient came to the ER, BP was 158/89 with heart rate of 116 and pulse oximetry of 86% on room air and 96% on 2 L of O2 by nasal cannula with temperature 98 and respiratory rate of 20.  Labs revealed a CO2 of 18 and otherwise unremarkable CMP.  BNP was 500.2.  High sensitive troponin I was 6 and later 7.  CBC showed hemoglobin of 4.3 and hematocrit 18 and platelets of 560 with microcytosis.  Blood group was a positive, with negative antibody screen. EKG as reviewed by me : EKG showed sinus tachycardia with rate 105. Imaging: Two-view chest x-ray showed cardiomegaly with mild vascular congestion and small pleural effusions with possible early interstitial edema, and retrocardiac moderate-sized esophageal hiatal hernia.  The patient was ordered 3 units of PRBCs.  She will be admitted to a stepdown unit  bed for further evaluation and management. PAST MEDICAL HISTORY:   Past Medical History:  Diagnosis Date  . Anemia    H/O  . Arthritis   . Complication of anesthesia    could feel everything when she was being extubated  . GERD (gastroesophageal reflux disease)    H/O  . Hypertension    H/O OVER A SHORT PERIOD OF TIME- LOST WEIGHT AND IS NO LONGER TAKING BP MED PER PCP  . Sleep apnea 1999   WAS TOLD SHE STOPPED BREATHING 80 TIMES DURING THE NIGHT.  NEVER WAS GIVEN CPAP MACHINE PER PT    PAST SURGICAL HISTORY:   Past Surgical History:  Procedure Laterality Date  . APPENDECTOMY    . SHOULDER ARTHROSCOPY Right 06/05/2015   Procedure: ARTHROSCOPY SHOULDER;  Surgeon: Norleen JINNY Maltos, MD;  Location: [facility] ORS;  Service: Orthopedics;  Laterality: Right;  . SHOULDER ARTHROSCOPY WITH DEBRIDEMENT AND BICEP TENDON REPAIR Right 05/13/2016   Procedure: SHOULDER ARTHROSCOPY WITH DEBRIDEMENT AND BICEP TENDON REPAIR;  Surgeon: Norleen JINNY Maltos, MD;  Location: [facility] OR[facility];  Service: Orthopedics;  Laterality: Right;  . SHOULDER ARTHROSCOPY WITH OPEN ROTATOR CUFF REPAIR Right 05/13/2016   Procedure: SHOULDER ARTHROSCOPY WITH OPEN ROTATOR CUFF REPAIR;  Surgeon: Norleen JINNY Maltos, MD;  Location: [facility] OR[facility];  Service: Orthopedics;  Laterality: Right;  . SHOULDER ARTHROSCOPY WITH OPEN ROTATOR CUFF REPAIR Right 03/17/2017   Procedure: SHOULDER ARTHROSCOPY WITH OPEN ROTATOR CUFF REPAIR;  Surgeon: Maltos Norleen JINNY, MD;  Location: [facility];  Service: Orthopedics;  Laterality: Right;  arthroscopic debridement   . SINUS IRRIGATION      SOCIAL HISTORY:   Social History   Tobacco Use  . Smoking status: Never  . Smokeless tobacco: Never  Substance Use Topics  . Alcohol use: No    FAMILY HISTORY:  Positive for CVA, and CHF.  DRUG ALLERGIES:   Allergies  Allergen Reactions  . Latex Other (See Comments)    blisters    REVIEW OF SYSTEMS:   ROS As per history of present illness. All pertinent systems were reviewed  above. Constitutional, HEENT, cardiovascular, respiratory, GI, GU, musculoskeletal, neuro, psychiatric, endocrine, integumentary and hematologic systems were reviewed and are otherwise negative/unremarkable except for positive findings mentioned above in the HPI.   MEDICATIONS AT HOME:   Prior to Admission medications   Medication Sig Start Date End Date Taking? Authorizing Provider  acetaminophen  (TYLENOL ) 500 MG tablet Take 1,500 mg by mouth every 6 (six) hours as needed for moderate pain or headache.    [provider]  HYDROmorphone  (DILAUDID ) 2 MG tablet Take 1 tablet (2 mg total) by mouth every 4 (four) hours as needed for severe pain. 03/17/17   Poggi, Norleen PARAS, MD  Melatonin 10 MG TABS Take 1 tablet by mouth at bedtime.    [provider]  ondansetron  (ZOFRAN  ODT) 4 MG disintegrating tablet Take 1 tablet (4 mg total) by mouth every 8 (eight) hours as needed for nausea or vomiting. 03/17/17   Poggi, Norleen PARAS, MD  oxyCODONE  (OXY IR/ROXICODONE ) 5 MG immediate release tablet Take 5 mg by mouth every 8 (eight) hours.     [provider]  prednisoLONE -Moxifloxacin  1-0.5 % SOLN Apply 2 drops to eye 4 (four) times daily. 06/25/17   Fisher, Devere ORN, PA-C      VITAL SIGNS:  Blood pressure (!) 134/49, pulse 89, temperature 98.1 F (36.7 C), temperature source Oral, resp. rate 20, weight 111.6 kg, SpO2 100%.  PHYSICAL EXAMINATION:  Physical Exam  GENERAL:  63 y.o.-year-old Caucasian female patient lying in the bed with no acute distress.  EYES: Pupils equal, round, reactive to light and accommodation. No scleral icterus.  Positive pallor.  Extraocular muscles intact.  HEENT: Head atraumatic, normocephalic. Oropharynx and nasopharynx clear.  NECK:  Supple, no jugular venous distention. No thyroid enlargement, no tenderness.  LUNGS: Diminished bibasal breath sounds with bibasal rales.  No use of accessory muscles of respiration.  CARDIOVASCULAR: Regular rate and rhythm, S1,  S2 normal. No murmurs, rubs, or gallops.  ABDOMEN: Soft, nondistended, nontender. Bowel sounds present. No organomegaly or mass.  EXTREMITIES: 1+ bilateral lower extremity pitting edema, with no cyanosis, or clubbing.  NEUROLOGIC: Cranial nerves II through XII are intact. Muscle strength 5/5 in all extremities. Sensation intact. Gait not checked.  PSYCHIATRIC: The patient is alert and oriented x 3.  Normal affect and good eye contact. SKIN: No obvious rash, lesion, or ulcer.   LABORATORY PANEL:   CBC Recent Labs  Lab 04/09/23 1830  WBC 7.8  HGB 4.3*  HCT 18.0*  PLT 560*   ------------------------------------------------------------------------------------------------------------------  Chemistries  Recent Labs  Lab 04/09/23 1830 04/09/23 1951  NA 136  --   K 4.1  --   CL 104  --   CO2 18*  --   GLUCOSE 129*  --   BUN 18  --   CREATININE 0.54  --   CALCIUM 8.9  --   AST  --  24  ALT  --  23  ALKPHOS  --  56  BILITOT  --  0.6   ------------------------------------------------------------------------------------------------------------------  Cardiac Enzymes No results for input(s): TROPONINI in the last 168 hours. ------------------------------------------------------------------------------------------------------------------  RADIOLOGY:  DG Chest 2 View Result Date: 04/09/2023 CLINICAL DATA:  Shortness of breath for 1 month. 20 pound weight gain over 6 weeks. Decreased oxygen saturation. EXAM: CHEST - 2 VIEW COMPARISON:  11/05/2019 FINDINGS: Cardiac enlargement. Mild central vascular congestion. Slight interstitial change in the left base may represent early edema. This is progressing since prior study. Small bilateral pleural effusions. No pneumothorax. Esophageal hiatal hernia behind the heart. IMPRESSION: Cardiac enlargement with mild vascular congestion, small pleural effusions, and possible early interstitial edema. Moderate-sized esophageal hiatal hernia behind  the heart. Electronically Signed   By: Elsie Gravely M.D.   On: 04/09/2023 18:55      IMPRESSION AND PLAN:  Assessment and Plan: * Symptomatic anemia - The patient be admitted to a stepdown unit bed. - She was typed and crossmatch and will be transfused 2 units of PRBCs. - She will be given 20 mg of IV Lasix  after the first and the second year in addition to scheduled IV Lasix . - We will obtain anemia workup including stool Hemoccult. - She will be placed on Protonix  empirically. - We will hold off NSAIDs.  Will also hold off steroids. - She may need a GI consult at some point, and inpatient  if she has positive stool Hemoccult.  Acute CHF (HCC) - The patient will be diuresed with IV Lasix . - Will obtain a 2D echo. - This could be high-output failure due to severe anemia. - Should be diuresed with IV Lasix  starting in a.m. - She will receive IV Lasix  after the first and the second unit of PRBCs and then 20 mg IV every 12 hours. - She will have strict I's and O's. - Daily weights will be obtained. - Cardiology consult will be obtained. - I notified Dr. Florencio about the patient.   GERD without esophagitis - The patient will be placed on IV PPI therapy.   DVT prophylaxis: Lovenox.  Advanced Care Planning:  Code Status: full code.  Family Communication:  The plan of care was discussed in details with the patient (and family). I answered all questions. The patient agreed to proceed with the above mentioned plan. Further management will depend upon hospital course. Disposition Plan: Back to previous home environment Consults called: Cardiology All the records are reviewed and case discussed with ED provider.  Status is: Inpatient  At the time of the admission, it appears that the appropriate admission status for this patient is inpatient.  This is judged to be reasonable and necessary in order to provide the required intensity of service to ensure the patient's safety given  the presenting symptoms, physical exam findings and initial radiographic and laboratory data in the context of comorbid conditions.  The patient requires inpatient status due to high intensity of service, high risk of further deterioration and high frequency of surveillance required.  I certify that at the time of admission, it is my clinical judgment that the patient will require inpatient hospital care extending more than 2 midnights.                            Dispo: The patient is from: Home              Anticipated d/c is to: Home  Patient currently is not medically stable to d/c.              Difficult to place patient: No  Madison DELENA Peaches M.D on 04/10/2023 at 2:21 AM  [facility]   From 7 PM-7 AM, contact night-coverage [email or web address]  CC: Primary care physician; [facility] [employer]

## 2023-04-09 NOTE — ED Triage Notes (Signed)
 Pt to ED via POV from home. Pt reports SOB x1 month. Pt reports went to [facility] today and xray showed PNA and fluid around lungs. PT also reports 20lb weight gain in last 6wks. Pt denies hx of CHF. RA sats 86%. Pt placed on 2L [place]

## 2023-04-10 DIAGNOSIS — I509 Heart failure, unspecified: Secondary | ICD-10-CM

## 2023-04-10 DIAGNOSIS — D508 Other iron deficiency anemias: Secondary | ICD-10-CM

## 2023-04-10 DIAGNOSIS — D649 Anemia, unspecified: Secondary | ICD-10-CM | POA: Diagnosis not present

## 2023-04-10 DIAGNOSIS — K219 Gastro-esophageal reflux disease without esophagitis: Secondary | ICD-10-CM | POA: Insufficient documentation

## 2023-04-10 DIAGNOSIS — D509 Iron deficiency anemia, unspecified: Secondary | ICD-10-CM | POA: Insufficient documentation

## 2023-04-10 LAB — CBC
HCT: 20.6 % — ABNORMAL LOW (ref 36.0–46.0)
Hemoglobin: 5.8 g/dL — ABNORMAL LOW (ref 12.0–15.0)
MCH: 20.8 pg — ABNORMAL LOW (ref 26.0–34.0)
MCHC: 28.2 g/dL — ABNORMAL LOW (ref 30.0–36.0)
MCV: 73.8 fL — ABNORMAL LOW (ref 80.0–100.0)
Platelets: 182 10*3/uL (ref 150–400)
RBC: 2.79 MIL/uL — ABNORMAL LOW (ref 3.87–5.11)
RDW: 23.2 % — ABNORMAL HIGH (ref 11.5–15.5)
WBC: 7.1 10*3/uL (ref 4.0–10.5)
nRBC: 0.6 % — ABNORMAL HIGH (ref 0.0–0.2)

## 2023-04-10 LAB — BASIC METABOLIC PANEL
Anion gap: 8 (ref 5–15)
BUN: 17 mg/dL (ref 8–23)
CO2: 24 mmol/L (ref 22–32)
Calcium: 8.8 mg/dL — ABNORMAL LOW (ref 8.9–10.3)
Chloride: 107 mmol/L (ref 98–111)
Creatinine, Ser: 0.59 mg/dL (ref 0.44–1.00)
GFR, Estimated: >60 mL/min (ref >60)
Glucose, Bld: 100 mg/dL — ABNORMAL HIGH (ref 70–99)
Potassium: 3.5 mmol/L (ref 3.5–5.1)
Sodium: 139 mmol/L (ref 135–145)

## 2023-04-10 LAB — FOLATE: Folate: 12.6 ng/mL (ref >5.9)

## 2023-04-10 LAB — HEMOGLOBIN: Hemoglobin: 6.8 g/dL — ABNORMAL LOW (ref 12.0–15.0)

## 2023-04-10 LAB — IRON AND TIBC
Iron: 21 ug/dL — ABNORMAL LOW (ref 28–170)
Saturation Ratios: 4 % — ABNORMAL LOW (ref 10.4–31.8)
TIBC: 521 ug/dL — ABNORMAL HIGH (ref 250–450)
UIBC: 500 ug/dL

## 2023-04-10 LAB — HIV ANTIBODY (ROUTINE TESTING W REFLEX): HIV Screen 4th Generation wRfx: NONREACTIVE

## 2023-04-10 LAB — HEMOGLOBIN AND HEMATOCRIT, BLOOD
HCT: 21.6 % — ABNORMAL LOW (ref 36.0–46.0)
Hemoglobin: 6.1 g/dL — ABNORMAL LOW (ref 12.0–15.0)

## 2023-04-10 LAB — VITAMIN B12: Vitamin B-12: 218 pg/mL (ref 180–914)

## 2023-04-10 LAB — FERRITIN: Ferritin: 2 ng/mL — ABNORMAL LOW (ref 11–307)

## 2023-04-10 LAB — OCCULT BLOOD X 1 CARD TO LAB, STOOL: Fecal Occult Bld: NEGATIVE

## 2023-04-10 LAB — PREPARE RBC (CROSSMATCH)

## 2023-04-10 MED ORDER — SODIUM CHLORIDE 0.9% IV SOLUTION: Freq: Once | INTRAVENOUS | Status: AC

## 2023-04-10 MED ORDER — SODIUM CHLORIDE 0.9% IV SOLUTION
Freq: Once | INTRAVENOUS | Status: AC
  Administered 2023-04-10: 10 mL/h via INTRAVENOUS
  Filled 2023-04-10: qty 250

## 2023-04-10 MED ORDER — PANTOPRAZOLE SODIUM 40 MG PO TBEC
40.0000 mg | DELAYED_RELEASE_TABLET | Freq: Two times a day (BID) | ORAL | Status: DC
  Administered 2023-04-10 – 2023-04-12 (×5): 40 mg via ORAL
  Filled 2023-04-10 (×5): qty 1

## 2023-04-10 NOTE — Progress Notes (Signed)
 Progress Note   Patient: Sydney Collins FMW:[id number] DOB: November 11, 1960 DOA: 04/09/2023     1 DOS: the patient was seen and examined on 04/10/2023   Brief hospital course: Sydney Collins is 63 year old Caucasian female with a history of sleep apnea, anemia, GERD, obesity, chronic right upper sided abdominal discomfort who presented to the emergency department with worsening shortness of breath, swelling and weight gain.   Patient was hypoxic in the emergency department requiring 2 L supplemental oxygen.  CBC showed hemoglobin 4.3, BNP 500, chest x-ray showed mild vascular congestion.  She is admitted for severe anemia, acute CHF.  Assessment and Plan: * Symptomatic anemia Severe anemia with Hb 4.3. No active bleeding.  GI consulted who recommended continue to transfuse to keep hemoglobin greater than 7, outpatient GI scopes once cardiology clears her. Continue to monitor H&H closely. 2 units of blood transfusion given.  I ordered 3rd unit to be given as hemoglobin at 6.1. Continue oral PPI. Patient is short of breath even with mild exertion.  She is requiring 2 to 3 L supplemental oxygen. Transferred out of progressive care unit for close monitoring.  Acute CHF (HCC) Continue IV diuresis with IV Lasix  20 mg twice daily. 2D echocardiogram ordered This could be high-output failure due to severe anemia. Cardiology evaluation appreciated. Monitor daily weights, strict input and output. Follow-up   Morbid obesity BMI 43.58 Diet, exercise and weight reduction advised.  GERD: Continue ppi     Out of bed to chair. Incentive spirometry. Nursing supportive care. Fall, aspiration precautions. DVT prophylaxis   Code Status: Full Code Subjective: Patient is seen and examined today morning.  She is lying comfortably, has been having exertional dyspnea since last few days.  Denies any bloody bowel movements.  No chest discomfort.  Physical Exam: Vitals:   04/10/23 1200 04/10/23 1353  04/10/23 1354 04/10/23 1409  BP: 112/65 (!) 140/63 (!) 140/63 133/69  Pulse: 81 85 85 89  Resp: 19 16 16 17   Temp:  98.2 F (36.8 C) 98.2 F (36.8 C) 98.1 F (36.7 C)  TempSrc:  Oral Oral Oral  SpO2: 100% 100%  100%  Weight:        General - Middle aged obese Caucasian female, mild respiratory distress HEENT - PERRLA, EOMI, atraumatic head, non tender sinuses. Lung - Clear, diffuse rales, rhonchi, no wheezes. Heart - S1, S2 heard, no murmurs, rubs, 1+ pitting pedal edema. Abdomen - Soft, non tender, no guarding, bowel sounds good Neuro - Alert, awake and oriented x 3, non focal exam. Skin - Warm and dry.  Data Reviewed:      Latest Ref Rng & Units 04/10/2023   11:10 AM 04/10/2023    6:26 AM 04/09/2023    6:30 PM  CBC  WBC 4.0 - 10.5 K/uL  7.1  7.8   Hemoglobin 12.0 - 15.0 g/dL 6.1  5.8  4.3   Hematocrit 36.0 - 46.0 % 21.6  20.6  18.0   Platelets 150 - 400 K/uL  182  560       Latest Ref Rng & Units 04/10/2023    5:15 AM 04/09/2023    6:30 PM 11/05/2019    9:43 AM  BMP  Glucose 70 - 99 mg/dL 899  870  99   BUN 8 - 23 mg/dL 17  18  10    Creatinine 0.44 - 1.00 mg/dL 9.40  9.45  9.19   Sodium 135 - 145 mmol/L 139  136  135   Potassium 3.5 -  5.1 mmol/L 3.5  4.1  3.7   Chloride 98 - 111 mmol/L 107  104  103   CO2 22 - 32 mmol/L 24  18  23    Calcium 8.9 - 10.3 mg/dL 8.8  8.9  8.1    DG Chest 2 View Result Date: 04/09/2023 CLINICAL DATA:  Shortness of breath for 1 month. 20 pound weight gain over 6 weeks. Decreased oxygen saturation. EXAM: CHEST - 2 VIEW COMPARISON:  11/05/2019 FINDINGS: Cardiac enlargement. Mild central vascular congestion. Slight interstitial change in the left base may represent early edema. This is progressing since prior study. Small bilateral pleural effusions. No pneumothorax. Esophageal hiatal hernia behind the heart. IMPRESSION: Cardiac enlargement with mild vascular congestion, small pleural effusions, and possible early interstitial edema. Moderate-sized  esophageal hiatal hernia behind the heart. Electronically Signed   By: Elsie Gravely M.D.   On: 04/09/2023 18:55   Family Communication: Discussed with patient, she understand and agree. All questions answereed.  Disposition: Status is: Inpatient Remains inpatient appropriate because: Severe anemia, CHF will need workup.  Planned Discharge Destination: Home with Home Health     Time spent: 42 minutes  Author: Concepcion Riser, MD 04/10/2023 4:09 PM Secure chat 7am to 7pm For on call review [email or web address].

## 2023-04-10 NOTE — Assessment & Plan Note (Addendum)
-   The patient be admitted to a stepdown unit bed. - She was typed and crossmatch and will be transfused 2 units of PRBCs. - She will be given 20 mg of IV Lasix  after the first and the second year in addition to scheduled IV Lasix . - We will obtain anemia workup including stool Hemoccult. - She will be placed on Protonix  empirically. - We will hold off NSAIDs.  Will also hold off steroids. - She may need a GI consult at some point, and inpatient  if she has positive stool Hemoccult.

## 2023-04-10 NOTE — Progress Notes (Signed)
 Heart Failure Navigator Progress Note  Assessed for Heart & Vascular TOC clinic readiness.  Patient does not meet criteria due to current [facility] patient.   Navigator will sign off at this time.  Charmaine Pines, RN, BSN [facility] Heart Failure Navigator Secure Chat Only

## 2023-04-10 NOTE — Consult Note (Signed)
 Inpatient Consultation   Patient ID: Sydney Collins is a 63 y.o. female.  Requesting Provider: Concepcion Riser, MD  Date of Admission: 04/09/2023  Date of Consult: 04/10/23   Reason for Consultation: anemia   Patient's Chief Complaint:   Chief Complaint  Patient presents with   Shortness of Breath    63 year old Caucasian female with a history of sleep apnea, anemia, GERD, obesity, chronic right upper sided abdominal discomfort who presented to the emergency department with worsening shortness of breath, swelling and weight gain.  Gastroenterology is consulted for anemia.  Daughter at bedside who provides additional information.  Patient also reports worsening fatigue over this period of time.  Symptoms positive for orthopnea, PND and shortness of breath on exertion.  This is all becomes significantly worse since the end of December.  She has had a 20 pound weight gain over the last 6 weeks.  Diet consists of multiple sodium laden foods (both [employer] and [employer] at bedside).  She is currently being evaluated for heart failure.  When she presented to the hospital she was hypertensive at 158/89 with a heart rate of 116.  SpO2 was 86% on room air and improved with no requirement for supplemental oxygen.  BNP elevated at 500.  Chest x-ray with bilateral pleural effusions and cardiomegaly appreciated.  Hiatal hernia noted  Hemoglobin low at 4.3 with MCV of 71.4; she has received 2 units of PRBCs with plan for third.  B12 low at 218 and iron  deficiency with ferritin of 2 iron  saturation of 4% TIBC 521. BUN/Cr 17/0.59 Only comparison hemoglobin 2021 was 11.2.  Was also low in 2015 at 10.3  Bowel movements have been regular without melena or hematochezia.  She denies any nausea vomiting hematemesis or coffee-ground emesis.  The emergency department also performed a fecal Hemoccult blood test that was negative.  Denies dysphagia and odynophagia. Chronic ruq discomfort remains unchanged  and previous w/u negative. 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  Daughter reports the patient has been taking ibuprofen 400-800mg  q4h (with some tylenol  alternation) for at least 1 week due to tooth pain.  Has noticed increased lower extremity swelling.  She does use delta 8 proximal no other tobacco and rare alcohol use. Denies Anti-plt agents, and anticoagulants Denies family history of gastrointestinal disease and malignancy Previous Endoscopies: None     Past Medical History:  Diagnosis Date   Anemia    H/O   Arthritis    Complication of anesthesia    could feel everything when she was being extubated   GERD (gastroesophageal reflux disease)    H/O   Hypertension    H/O OVER A SHORT PERIOD OF TIME- LOST WEIGHT AND IS NO LONGER TAKING BP MED PER PCP   Sleep apnea 1999   WAS TOLD SHE STOPPED BREATHING 80 TIMES DURING THE NIGHT.  NEVER WAS GIVEN CPAP MACHINE PER PT    Past Surgical History:  Procedure Laterality Date   APPENDECTOMY     SHOULDER ARTHROSCOPY Right 06/05/2015   Procedure: ARTHROSCOPY SHOULDER;  Surgeon: Norleen JINNY Maltos, MD;  Location: [facility] ORS;  Service: Orthopedics;  Laterality: Right;   SHOULDER ARTHROSCOPY WITH DEBRIDEMENT AND BICEP TENDON REPAIR Right 05/13/2016   Procedure: SHOULDER ARTHROSCOPY WITH DEBRIDEMENT AND BICEP TENDON REPAIR;  Surgeon: Norleen JINNY Maltos, MD;  Location: [facility] ORS;  Service: Orthopedics;  Laterality: Right;   SHOULDER ARTHROSCOPY WITH OPEN ROTATOR CUFF REPAIR Right 05/13/2016   Procedure: SHOULDER ARTHROSCOPY WITH OPEN ROTATOR CUFF REPAIR;  Surgeon: Norleen JINNY Maltos, MD;  Location: [facility] ORS;  Service: Orthopedics;  Laterality: Right;   SHOULDER ARTHROSCOPY WITH OPEN ROTATOR CUFF REPAIR Right 03/17/2017   Procedure: SHOULDER ARTHROSCOPY WITH OPEN ROTATOR CUFF REPAIR;  Surgeon: Maltos Norleen JINNY, MD;  Location: [facility] ORS;  Service: Orthopedics;  Laterality: Right;  arthroscopic debridement    SINUS IRRIGATION      Allergies  Allergen Reactions   Latex  Other (See Comments)    blisters    History reviewed. No pertinent family history.  Social History   Tobacco Use   Smoking status: Never   Smokeless tobacco: Never  Vaping Use   Vaping status: Never Used  Substance Use Topics   Alcohol use: No   Drug use: No     Pertinent GI related history and allergies were reviewed with the patient  Review of Systems  Constitutional:  Positive for activity change, fatigue and unexpected weight change. Negative for appetite change, chills, diaphoresis and fever.  HENT:  Positive for dental problem and nosebleeds. Negative for trouble swallowing and voice change.   Respiratory:  Positive for shortness of breath. Negative for wheezing.   Cardiovascular:  Positive for leg swelling. Negative for chest pain and palpitations.  Gastrointestinal:  Negative for abdominal distention, abdominal pain, anal bleeding, blood in stool, constipation, diarrhea, nausea, rectal pain and vomiting.  Musculoskeletal:  Positive for arthralgias. Negative for myalgias.  Skin:  Positive for pallor. Negative for color change.  Neurological:  Positive for dizziness, weakness and light-headedness. Negative for syncope.  Psychiatric/Behavioral:  Negative for agitation and confusion.   All other systems reviewed and are negative.    Medications Home Medications No current facility-administered medications on file prior to encounter.   Current Outpatient Medications on File Prior to Encounter  Medication Sig Dispense Refill   acetaminophen  (TYLENOL ) 500 MG tablet Take 1,500 mg by mouth every 6 (six) hours as needed for moderate pain or headache.     HYDROmorphone  (DILAUDID ) 2 MG tablet Take 1 tablet (2 mg total) by mouth every 4 (four) hours as needed for severe pain. 50 tablet 0   ibuprofen (ADVIL) 800 MG tablet Take 800 mg by mouth every 8 (eight) hours as needed.     Melatonin 10 MG TABS Take 1 tablet by mouth at bedtime.     predniSONE  (DELTASONE ) 20 MG tablet Take  20 mg by mouth daily with breakfast.     ondansetron  (ZOFRAN  ODT) 4 MG disintegrating tablet Take 1 tablet (4 mg total) by mouth every 8 (eight) hours as needed for nausea or vomiting. (Patient not taking: Reported on 04/10/2023) 20 tablet 1   oxyCODONE  (OXY IR/ROXICODONE ) 5 MG immediate release tablet Take 5 mg by mouth every 8 (eight) hours. (Patient not taking: Reported on 04/10/2023)     prednisoLONE -Moxifloxacin  1-0.5 % SOLN Apply 2 drops to eye 4 (four) times daily. (Patient not taking: Reported on 04/10/2023) 5 mL 0   Pertinent GI related medications were reviewed with the patient  Inpatient Medications  Current Facility-Administered Medications:    acetaminophen  (TYLENOL ) tablet 650 mg, 650 mg, Oral, Q6H PRN **OR** acetaminophen  (TYLENOL ) suppository 650 mg, 650 mg, Rectal, Q6H PRN, Mansy, Jan A, MD   furosemide  (LASIX ) injection 20 mg, 20 mg, Intravenous, Q12H, Mansy, Jan A, MD, 20 mg at 04/10/23 9260   HYDROmorphone  (DILAUDID ) tablet 2 mg, 2 mg, Oral, Q4H PRN, Mansy, Jan A, MD   magnesium  hydroxide (MILK OF MAGNESIA) suspension 30 mL, 30 mL, Oral, Daily PRN, Mansy, Jan  A, MD   melatonin tablet 5 mg, 5 mg, Oral, QHS, Mansy, Jan A, MD, 5 mg at 04/09/23 2149   ondansetron  (ZOFRAN ) tablet 4 mg, 4 mg, Oral, Q6H PRN **OR** ondansetron  (ZOFRAN ) injection 4 mg, 4 mg, Intravenous, Q6H PRN, Mansy, Jan A, MD   traZODone  (DESYREL ) tablet 25 mg, 25 mg, Oral, QHS PRN, Mansy, Jan A, MD, 25 mg at 04/10/23 9740  Current Outpatient Medications:    acetaminophen  (TYLENOL ) 500 MG tablet, Take 1,500 mg by mouth every 6 (six) hours as needed for moderate pain or headache., Disp: , Rfl:    HYDROmorphone  (DILAUDID ) 2 MG tablet, Take 1 tablet (2 mg total) by mouth every 4 (four) hours as needed for severe pain., Disp: 50 tablet, Rfl: 0   ibuprofen (ADVIL) 800 MG tablet, Take 800 mg by mouth every 8 (eight) hours as needed., Disp: , Rfl:    Melatonin 10 MG TABS, Take 1 tablet by mouth at bedtime., Disp: , Rfl:     predniSONE  (DELTASONE ) 20 MG tablet, Take 20 mg by mouth daily with breakfast., Disp: , Rfl:    ondansetron  (ZOFRAN  ODT) 4 MG disintegrating tablet, Take 1 tablet (4 mg total) by mouth every 8 (eight) hours as needed for nausea or vomiting. (Patient not taking: Reported on 04/10/2023), Disp: 20 tablet, Rfl: 1   oxyCODONE  (OXY IR/ROXICODONE ) 5 MG immediate release tablet, Take 5 mg by mouth every 8 (eight) hours. (Patient not taking: Reported on 04/10/2023), Disp: , Rfl:    prednisoLONE -Moxifloxacin  1-0.5 % SOLN, Apply 2 drops to eye 4 (four) times daily. (Patient not taking: Reported on 04/10/2023), Disp: 5 mL, Rfl: 0   acetaminophen  **OR** acetaminophen , HYDROmorphone , magnesium  hydroxide, ondansetron  **OR** ondansetron  (ZOFRAN ) IV, traZODone    Objective   Vitals:   04/10/23 0515 04/10/23 0600 04/10/23 0700 04/10/23 0704  BP: 129/71 136/76 (!) 144/97 (!) 152/71  Pulse: 63 68 83 77  Resp:  20 19 19   Temp:  98.3 F (36.8 C)  97.6 F (36.4 C)  TempSrc:  Oral  Oral  SpO2: 100% 100% 98% 100%  Weight:         Physical Exam Vitals and nursing note reviewed.  Constitutional:      General: She is not in acute distress.    Appearance: Normal appearance. She is obese. She is ill-appearing. She is not toxic-appearing or diaphoretic.  HENT:     Head: Normocephalic and atraumatic.     Nose: Nose normal.     Mouth/Throat:     Mouth: Mucous membranes are moist.     Pharynx: Oropharynx is clear.  Eyes:     General: No scleral icterus.    Extraocular Movements: Extraocular movements intact.  Neck:     Comments: + JVD + HJR Cardiovascular:     Rate and Rhythm: Normal rate and regular rhythm.  Pulmonary:     Breath sounds: No wheezing or rhonchi.     Comments: Diminished bilaterally; new supplemental O2 requirement Abdominal:     General: Bowel sounds are normal. There is no distension.     Palpations: Abdomen is soft.     Tenderness: There is no abdominal tenderness. There is no guarding or  rebound.  Musculoskeletal:     Cervical back: Neck supple.     Right lower leg: Edema present.     Left lower leg: Edema present.  Skin:    General: Skin is warm and dry.     Coloration: Skin is pale. Skin is not jaundiced.  Neurological:     General: No focal deficit present.     Mental Status: She is alert and oriented to person, place, and time. Mental status is at baseline.  Psychiatric:        Mood and Affect: Mood normal.        Behavior: Behavior normal.        Thought Content: Thought content normal.        Judgment: Judgment normal.     Laboratory Data Recent Labs  Lab 04/09/23 1830 04/10/23 0626  WBC 7.8 7.1  HGB 4.3* 5.8*  HCT 18.0* 20.6*  PLT 560* 182   Recent Labs  Lab 04/09/23 1830 04/09/23 1951 04/10/23 0515  NA 136  --  139  K 4.1  --  3.5  CL 104  --  107  CO2 18*  --  24  BUN 18  --  17  CALCIUM 8.9  --  8.8*  PROT  --  7.1  --   BILITOT  --  0.6  --   ALKPHOS  --  56  --   ALT  --  23  --   AST  --  24  --   GLUCOSE 129*  --  100*   Recent Labs  Lab 04/09/23 1951  INR 1.2    No results for input(s): LIPASE in the last 72 hours.      Imaging Studies: DG Chest 2 View Result Date: 04/09/2023 CLINICAL DATA:  Shortness of breath for 1 month. 20 pound weight gain over 6 weeks. Decreased oxygen saturation. EXAM: CHEST - 2 VIEW COMPARISON:  11/05/2019 FINDINGS: Cardiac enlargement. Mild central vascular congestion. Slight interstitial change in the left base may represent early edema. This is progressing since prior study. Small bilateral pleural effusions. No pneumothorax. Esophageal hiatal hernia behind the heart. IMPRESSION: Cardiac enlargement with mild vascular congestion, small pleural effusions, and possible early interstitial edema. Moderate-sized esophageal hiatal hernia behind the heart. Electronically Signed   By: Elsie Gravely M.D.   On: 04/09/2023 18:55    Assessment:   # Suspected Acute heart failure exacerbation - elevated  BNP with clinical presentation and exam findings of heart failure - awaiting echocardiogram - cxr with cardiomegaly and central vascular congestion with bilateral pleural effusions  # Severe symptomatic anemia - no signs of gib loss; hemodynamically stable - p/w hgb of 4.3 - no clinical gi blood loss sx, FOBT-; bun/cr ratio not elevated - previous nsaid use   # hiatal hernia - noted on CXR  # chronic ruq abdominal pain - unchanged  # chronic nsaid use # obesity #osa  Plan:   Cardiology is evaluating pt for new heart failure and she has new o2 requirement at this time Given that she has no signs of gib, her IDA can be worked up as outpatient with bidirectional endoscopy after cardiac workup and clearance My office will reach out to her to help her get office follow up and bidirectional endoscopy scheduled. I have also provided our office number to the patient's daughter. Ordered H Pylori and Celiac testing Avoid aspirin and NSAIDs (ibuprofen, Motrin, Advil, naproxen, Aleve, Naprosyn, Goody's powder, & BC powder). Recommend initiation of protonix  40 mg oral bid for 8 weeks Recommend initiation of vitamin b12 and iv iron  supplementation while in the hospital Currently tolerating po without issue  Monitor H&H.  Transfusion and resuscitation as per primary team (would need hgb >7 prior to any procedure); pt about to receive her 3rd unit Avoid  frequent lab draws to prevent lab induced anemia Supportive care and antiemetics as per primary team Monitor for GIB.  Management of other medical comorbidities as per primary team  Will follow up as outpatient as above. Should something with patient gi condition change, please contact us   GI to sign off. Available as needed. Please do not hesitate to call regarding questions or concerns.  I personally performed the service.  Thank you for allowing us  to participate in this patient's care.   Elspeth Ozell Jungling, DO [facility]  Gastroenterology  Portions of the record may have been created with voice recognition software. Occasional wrong-word or 'sound-a-like' substitutions may have occurred due to the inherent limitations of voice recognition software.  Read the chart carefully and recognize, using context, where substitutions may have occurred.

## 2023-04-10 NOTE — Assessment & Plan Note (Signed)
-   The patient will be diuresed with IV Lasix . - Will obtain a 2D echo. - This could be high-output failure due to severe anemia. - Should be diuresed with IV Lasix  starting in a.m. - She will receive IV Lasix  after the first and the second unit of PRBCs and then 20 mg IV every 12 hours. - She will have strict I's and O's. - Daily weights will be obtained. - Cardiology consult will be obtained. - I notified Dr. Florencio about the patient.

## 2023-04-10 NOTE — ED Notes (Signed)
 Per lab two units of blood are issued,  wait two hours after second unit to draw H&H and if needed consult doctor for third unit

## 2023-04-10 NOTE — Consult Note (Signed)
 [facility] CLINIC CARDIOLOGY CONSULT NOTE       Patient ID: Sydney Collins MRN: [id number] DOB/AGE: 1960-04-13 63 y.o.  Admit date: 04/09/2023 Referring Physician Dr. Madison [facility]  Primary Cardiologist None Reason for Consultation AoCHF  HPI: Sydney Collins is a 63 y.o. female  with a past medical history of hypertension, GERD, OSA who presented to the ED on 04/09/2023 for worsening shortness of breath.  Found to have elevated BNP.  Cardiology was consulted for further evaluation.   Patient reports that over the last 2 months she has had gradually worsening shortness of breath.  States that her symptoms started after she had a URI in December.  Reports that she has gained about 23 pounds in the last month.  Over the last few weeks she has had significantly worsening dyspnea on exertion and decided to come to the ED for further evaluation given this.  Workup in the ED notable for creatinine 0.54, potassium 4.1, hemoglobin 4.3, WBC 7.8.  BNP elevated at 500.  Troponins normal at 6 > 7.  Chest x-ray demonstrated pulmonary vascular congestion.  Hemoccult was negative.  She received blood transfusion overnight in the ED and was started on IV Lasix .  At the time of my evaluation this morning patient is resting in ED stretcher with family member present at bedside.  We discussed her symptoms in further detail.  She states that along with the shortness of breath which has been worsening for the last few months she has also had worsening lower extremity edema.  Endorses occasional episodes of chest tightness over the last few months as well which typically occurs with worsening shortness of breath.  She denies any dizziness or lightheadedness.  Does endorse significant fatigue.  Denies any history of heart failure or anemia.  Review of systems complete and found to be negative unless listed above    Past Medical History:  Diagnosis Date   Anemia    H/O    Arthritis    Complication of anesthesia    could feel everything when she was being extubated   GERD (gastroesophageal reflux disease)    H/O   Hypertension    H/O OVER A SHORT PERIOD OF TIME- LOST WEIGHT AND IS NO LONGER TAKING BP MED PER PCP   Sleep apnea 1999   WAS TOLD SHE STOPPED BREATHING 80 TIMES DURING THE NIGHT.  NEVER WAS GIVEN CPAP MACHINE PER PT    Past Surgical History:  Procedure Laterality Date   APPENDECTOMY     SHOULDER ARTHROSCOPY Right 06/05/2015   Procedure: ARTHROSCOPY SHOULDER;  Surgeon: Norleen JINNY Maltos, MD;  Location: [facility] ORS;  Service: Orthopedics;  Laterality: Right;   SHOULDER ARTHROSCOPY WITH DEBRIDEMENT AND BICEP TENDON REPAIR Right 05/13/2016   Procedure: SHOULDER ARTHROSCOPY WITH DEBRIDEMENT AND BICEP TENDON REPAIR;  Surgeon: Norleen JINNY Maltos, MD;  Location: [facility] ORS;  Service: Orthopedics;  Laterality: Right;   SHOULDER ARTHROSCOPY WITH OPEN ROTATOR CUFF REPAIR Right 05/13/2016   Procedure: SHOULDER ARTHROSCOPY WITH OPEN ROTATOR CUFF REPAIR;  Surgeon: Norleen JINNY Maltos, MD;  Location: [facility] ORS;  Service: Orthopedics;  Laterality: Right;   SHOULDER ARTHROSCOPY WITH OPEN ROTATOR CUFF REPAIR Right 03/17/2017   Procedure: SHOULDER ARTHROSCOPY WITH OPEN ROTATOR CUFF REPAIR;  Surgeon: Maltos Norleen JINNY, MD;  Location: [facility] ORS;  Service: Orthopedics;  Laterality: Right;  arthroscopic debridement    SINUS IRRIGATION      (Not in a hospital admission)  Social History   Socioeconomic  History   Marital status: Legally Separated    Spouse name: Not on file   Number of children: Not on file   Years of education: Not on file   Highest education level: Not on file  Occupational History   Not on file  Tobacco Use   Smoking status: Never   Smokeless tobacco: Never  Vaping Use   Vaping status: Never Used  Substance and Sexual Activity   Alcohol use: No   Drug use: No   Sexual activity: Yes  Other Topics Concern   Not on file  Social History Narrative   Not on file   Social  Drivers of Health   Financial Resource Strain: Not on file  Food Insecurity: Not on file  Transportation Needs: Not on file  Physical Activity: Not on file  Stress: Not on file  Social Connections: Not on file  Intimate Partner Violence: Not on file    History reviewed. No pertinent family history.   Vitals:   04/10/23 0600 04/10/23 0700 04/10/23 0704 04/10/23 0900  BP: 136/76 (!) 144/97 (!) 152/71 124/63  Pulse: 68 83 77 78  Resp: 20 19 19 20   Temp: 98.3 F (36.8 C)  97.6 F (36.4 C)   TempSrc: Oral  Oral   SpO2: 100% 98% 100% 100%  Weight:        PHYSICAL EXAM General: Well-appearing female, well nourished, in no acute distress. HEENT: Normocephalic and atraumatic. Neck: No JVD.  Lungs: Normal respiratory effort on room air.  Basilar crackles Heart: HRRR. Normal S1 and S2 without gallops or murmurs.  Abdomen: Non-distended appearing.  Msk: Normal strength and tone for age. Extremities: Warm and well perfused. No clubbing, cyanosis.  1+ pitting edema.  Neuro: Alert and oriented X 3. Psych: Answers questions appropriately.   Labs: Basic Metabolic Panel: Recent Labs    04/09/23 1830 04/10/23 0515  NA 136 139  K 4.1 3.5  CL 104 107  CO2 18* 24  GLUCOSE 129* 100*  BUN 18 17  CREATININE 0.54 0.59  CALCIUM 8.9 8.8*   Liver Function Tests: Recent Labs    04/09/23 1951  AST 24  ALT 23  ALKPHOS 56  BILITOT 0.6  PROT 7.1  ALBUMIN 3.8   No results for input(s): LIPASE, AMYLASE in the last 72 hours. CBC: Recent Labs    04/09/23 1830 04/10/23 0626  WBC 7.8 7.1  HGB 4.3* 5.8*  HCT 18.0* 20.6*  MCV 71.4* 73.8*  PLT 560* 182   Cardiac Enzymes: Recent Labs    04/09/23 1830 04/09/23 1951  TROPONINIHS 6 7   BNP: Recent Labs    04/09/23 1937  BNP 500.2*   D-Dimer: No results for input(s): DDIMER in the last 72 hours. Hemoglobin A1C: No results for input(s): HGBA1C in the last 72 hours. Fasting Lipid Panel: No results for input(s):  CHOL, HDL, LDLCALC, TRIG, CHOLHDL, LDLDIRECT in the last 72 hours. Thyroid Function Tests: No results for input(s): TSH, T4TOTAL, T3FREE, THYROIDAB in the last 72 hours.  Invalid input(s): FREET3 Anemia Panel: Recent Labs    04/09/23 1830 04/10/23 0047  VITAMINB12  --  218  FOLATE  --  12.6  FERRITIN  --  2*  TIBC  --  521*  IRON   --  21*  RETICCTPCT 3.8*  --      Radiology: DG Chest 2 View Result Date: 04/09/2023 CLINICAL DATA:  Shortness of breath for 1 month. 20 pound weight gain over 6 weeks. Decreased oxygen saturation. EXAM:  CHEST - 2 VIEW COMPARISON:  11/05/2019 FINDINGS: Cardiac enlargement. Mild central vascular congestion. Slight interstitial change in the left base may represent early edema. This is progressing since prior study. Small bilateral pleural effusions. No pneumothorax. Esophageal hiatal hernia behind the heart. IMPRESSION: Cardiac enlargement with mild vascular congestion, small pleural effusions, and possible early interstitial edema. Moderate-sized esophageal hiatal hernia behind the heart. Electronically Signed   By: Elsie Gravely M.D.   On: 04/09/2023 18:55    ECHO ordered  TELEMETRY reviewed by me 04/10/2023: Sinus rhythm rate 70s  EKG reviewed by me: Sinus tachycardia rate 105 bpm, nonischemic  Data reviewed by me 04/10/2023: last 24h vitals tele labs imaging I/O ED provider note, admission H&P  Principal Problem:   Symptomatic anemia Active Problems:   GERD without esophagitis   Acute CHF (HCC)   Iron  deficiency anemia    ASSESSMENT AND PLAN:  Sydney Collins is a 63 y.o. female  with a past medical history of hypertension, GERD, OSA who presented to the ED on 04/09/2023 for worsening shortness of breath.  Found to have elevated BNP.  Cardiology was consulted for further evaluation.   # Severe anemia # Shortness of breath # Elevated BNP Patient presented with worsening shortness of breath/dyspnea on exertion for the last 2  months with associated 23 pound weight gain.  Found to have significant anemia with hemoglobin of 4.3 on admission.  BNP elevated at 500 and pulmonary vascular congestion noted on chest x-ray.  She was transfused in the ED and started on IV Lasix .  She has no history of heart failure. -Echo ordered -Continue IV Lasix  20 mg twice daily. -Further recommendations for medications pending results of echocardiogram. -Recommend further workup of anemia as per primary.   This patient's plan of care was discussed and created with Dr. Florencio and he is in agreement.  Signed: Danita Bloch, PA-C  04/10/2023, 10:17 AM [facility] Cardiology

## 2023-04-10 NOTE — Assessment & Plan Note (Signed)
-   The patient will be placed on IV PPI therapy

## 2023-04-10 NOTE — ED Notes (Signed)
Bedside occult negative

## 2023-04-10 NOTE — ED Notes (Signed)
Blood consent signed by patient and this RN.  

## 2023-04-10 NOTE — ED Notes (Signed)
 Pt AOX4, eating fast food breakfast brought by visitor at time, pt remains on 2 liter's [place].

## 2023-04-11 ENCOUNTER — Inpatient Hospital Stay
Admit: 2023-04-11 | Discharge: 2023-04-11 | Disposition: A | Discharge status: Home / Self Care | Payer: Medicaid Other | Attending: Student

## 2023-04-11 DIAGNOSIS — D649 Anemia, unspecified: Secondary | ICD-10-CM | POA: Diagnosis not present

## 2023-04-11 DIAGNOSIS — I509 Heart failure, unspecified: Secondary | ICD-10-CM | POA: Diagnosis not present

## 2023-04-11 DIAGNOSIS — D508 Other iron deficiency anemias: Secondary | ICD-10-CM | POA: Diagnosis not present

## 2023-04-11 DIAGNOSIS — K219 Gastro-esophageal reflux disease without esophagitis: Secondary | ICD-10-CM | POA: Diagnosis not present

## 2023-04-11 LAB — ECHOCARDIOGRAM COMPLETE
AR max vel: 2.37 cm2
AV Peak grad: 12.3 mm[Hg]
Ao pk vel: 1.75 m/s
Area-P 1/2: 5.34 cm2
S' Lateral: 2.9 cm
Weight: 3808 [oz_av]

## 2023-04-11 LAB — PREPARE RBC (CROSSMATCH)

## 2023-04-11 LAB — HEMOGLOBIN: Hemoglobin: 8.8 g/dL — ABNORMAL LOW (ref 12.0–15.0)

## 2023-04-11 MED ORDER — MORPHINE SULFATE (PF) 2 MG/ML IV SOLN
1.0000 mg | INTRAVENOUS | Status: DC | PRN
  Administered 2023-04-11 – 2023-04-12 (×2): 1 mg via INTRAVENOUS
  Filled 2023-04-11 (×2): qty 1

## 2023-04-11 NOTE — Plan of Care (Signed)
  Problem: Education: Goal: Knowledge of General Education information will improve Description: Including pain rating scale, medication(s)/side effects and non-pharmacologic comfort measures Outcome: Progressing   Problem: Health Behavior/Discharge Planning: Goal: Ability to manage health-related needs will improve Outcome: Progressing   Problem: Clinical Measurements: Goal: Ability to maintain clinical measurements within normal limits will improve Outcome: Progressing   Problem: Elimination: Goal: Will not experience complications related to bowel motility Outcome: Progressing

## 2023-04-11 NOTE — Progress Notes (Signed)
 Blood transfusion completed as ordered. No S/S of adverse reactions. Two hour post RBC infusion to redraw HGB ordered. Patient requested MOM, states she feels constipated.

## 2023-04-11 NOTE — Progress Notes (Signed)
 [facility] Cardiology    SUBJECTIVE: Resting comfortably in bed denies any chest pain shortness of breath improved still complains of weakness denies any obvious blood loss or bleeding   Vitals:   04/11/23 0400 04/11/23 0405 04/11/23 0500 04/11/23 0926  BP: (!) 143/75 (!) 143/75  (!) 116/95  Pulse: 70 70  82  Resp:    16  Temp: 97.7 F (36.5 C) 97.7 F (36.5 C)  97.6 F (36.4 C)  TempSrc: Oral Oral  Oral  SpO2: 100%   100%  Weight:   108 kg      Intake/Output Summary (Last 24 hours) at 04/11/2023 1038 Last data filed at 04/11/2023 0400 Gross per 24 hour  Intake 1100 ml  Output --  Net 1100 ml      PHYSICAL EXAM  General: Well developed, well nourished, in no acute distress HEENT:  Normocephalic and atramatic Neck:  No JVD.  Lungs: Clear bilaterally to auscultation and percussion. Heart: HRRR . Normal S1 and S2 without gallops or murmurs.  Abdomen: Bowel sounds are positive, abdomen soft and non-tender  Msk:  Back normal, normal gait. Normal strength and tone for age. Extremities: No clubbing, cyanosis or edema.   Neuro: Alert and oriented X 3. Psych:  Good affect, responds appropriately   LABS: Basic Metabolic Panel: Recent Labs    04/09/23 1830 04/10/23 0515  NA 136 139  K 4.1 3.5  CL 104 107  CO2 18* 24  GLUCOSE 129* 100*  BUN 18 17  CREATININE 0.54 0.59  CALCIUM 8.9 8.8*   Liver Function Tests: Recent Labs    04/09/23 1951  AST 24  ALT 23  ALKPHOS 56  BILITOT 0.6  PROT 7.1  ALBUMIN 3.8   No results for input(s): LIPASE, AMYLASE in the last 72 hours. CBC: Recent Labs    04/09/23 1830 04/10/23 0626 04/10/23 1110 04/10/23 2300 04/11/23 0654  WBC 7.8 7.1  --   --   --   HGB 4.3* 5.8* 6.1* 6.8* 8.8*  HCT 18.0* 20.6* 21.6*  --   --   MCV 71.4* 73.8*  --   --   --   PLT 560* 182  --   --   --    Cardiac Enzymes: No results for input(s): CKTOTAL, CKMB, CKMBINDEX, TROPONINI in the last 72 hours. BNP: Invalid input(s):  POCBNP D-Dimer: No results for input(s): DDIMER in the last 72 hours. Hemoglobin A1C: No results for input(s): HGBA1C in the last 72 hours. Fasting Lipid Panel: No results for input(s): CHOL, HDL, LDLCALC, TRIG, CHOLHDL, LDLDIRECT in the last 72 hours. Thyroid Function Tests: No results for input(s): TSH, T4TOTAL, T3FREE, THYROIDAB in the last 72 hours.  Invalid input(s): FREET3 Anemia Panel: Recent Labs    04/09/23 1830 04/10/23 0047  VITAMINB12  --  218  FOLATE  --  12.6  FERRITIN  --  2*  TIBC  --  521*  IRON   --  21*  RETICCTPCT 3.8*  --     DG Chest 2 View Result Date: 04/09/2023 CLINICAL DATA:  Shortness of breath for 1 month. 20 pound weight gain over 6 weeks. Decreased oxygen saturation. EXAM: CHEST - 2 VIEW COMPARISON:  11/05/2019 FINDINGS: Cardiac enlargement. Mild central vascular congestion. Slight interstitial change in the left base may represent early edema. This is progressing since prior study. Small bilateral pleural effusions. No pneumothorax. Esophageal hiatal hernia behind the heart. IMPRESSION: Cardiac enlargement with mild vascular congestion, small pleural effusions, and possible early interstitial edema. Moderate-sized  esophageal hiatal hernia behind the heart. Electronically Signed   By: Elsie Gravely M.D.   On: 04/09/2023 18:55     Echo pending  TELEMETRY: Normal sinus rhythm nonspecific ST-T wave changes rate of 70:  ASSESSMENT AND PLAN:  Principal Problem:   Symptomatic anemia Active Problems:   GERD without esophagitis   Acute CHF (HCC)   Iron  deficiency anemia    Plan Acute congestive heart failure unclear etiology or characteristics awaiting echocardiogram for assessment of left ventricular function patient had elevated BNP and acute profound anemia Anemia unclear etiology agree with transfusion to at least 7 which seems to have helped Continue to follow BMP for improvement in heart failure symptoms Obesity  recommend weight loss exercise portion control Obstructive sleep apnea would recommend sleep study CPAP if indicated weight loss Hypertension rate controlled currently we will consider ACE ARB beta-blocker spironolactone Continue IV diuretic therapy to help with diuresis and shortness of breath No invasive procedures schedule or recommended at this stage  Cara JONETTA Lovelace, MD 04/11/2023 10:38 AM

## 2023-04-11 NOTE — Progress Notes (Signed)
 Progress Note   Patient: Sydney Collins FMW:[id number] DOB: April 22, 1960 DOA: 04/09/2023     2 DOS: the patient was seen and examined on 04/11/2023   Brief hospital course: Sydney Collins is 63 year old Caucasian female with a history of sleep apnea, anemia, GERD, obesity, chronic right upper sided abdominal discomfort who presented to the emergency department with worsening shortness of breath, swelling and weight gain.   Patient was hypoxic in the emergency department requiring 2 L supplemental oxygen.  CBC showed hemoglobin 4.3, BNP 500, chest x-ray showed mild vascular congestion.  She is admitted for severe anemia, acute CHF.  Assessment and Plan: * Symptomatic anemia Severe anemia with Hb 4.3 on presentation. No active bleeding.  GI consulted who recommended continue to transfuse to keep hemoglobin greater than 7, outpatient GI scopes once cardiology clears her. Continue to monitor H&H closely. Total 3 units of PRBC given hemoglobin today at 8.8. Continue oral PPI. She is requiring off supplemental oxygen. Advised out of bed to chair.  Acute diastolic CHF (HCC) Continue IV Lasix  20 mg twice daily.  2D echocardiogram reviewed, EF 55-60%, no wall motion abnormalities. This could be high-output failure due to severe anemia. Cardiology follow up appreciated. Monitor daily weights, strict input and output.  Morbid obesity BMI 43.58 Diet, exercise and weight reduction advised. Sleep study outpatient advised.  GERD: Continue PPI.    Out of bed to chair. Incentive spirometry. Nursing supportive care. Fall, aspiration precautions. DVT prophylaxis   Code Status: Full Code  Subjective: Patient is seen and examined today morning.  She is lying comfortably, denies chest pain or shortness of breath. Leg swelling improved,  Did not get out of bed.  Eating fair.  Physical Exam: Vitals:   04/11/23 0500 04/11/23 0926 04/11/23 1321 04/11/23 1526  BP:  (!) 116/95 (!) 127/53 128/61  Pulse:   82 79 75  Resp:  16 16 18   Temp:  97.6 F (36.4 C) 98.8 F (37.1 C) 99 F (37.2 C)  TempSrc:  Oral Oral Oral  SpO2:  100% 100% 98%  Weight: 108 kg       General - Middle aged obese Caucasian female, no respiratory distress HEENT - PERRLA, EOMI, atraumatic head, non tender sinuses. Lung - Clear, diffuse rales, rhonchi, no wheezes. Heart - S1, S2 heard, no murmurs, rubs, trace pitting pedal edema. Abdomen - Soft, non tender, no guarding, bowel sounds good Neuro - Alert, awake and oriented x 3, non focal exam. Skin - Warm and dry.  Data Reviewed:      Latest Ref Rng & Units 04/11/2023    6:54 AM 04/10/2023   11:00 PM 04/10/2023   11:10 AM  CBC  Hemoglobin 12.0 - 15.0 g/dL 8.8  6.8  6.1   Hematocrit 36.0 - 46.0 %   21.6       Latest Ref Rng & Units 04/10/2023    5:15 AM 04/09/2023    6:30 PM 11/05/2019    9:43 AM  BMP  Glucose 70 - 99 mg/dL 899  870  99   BUN 8 - 23 mg/dL 17  18  10    Creatinine 0.44 - 1.00 mg/dL 9.40  9.45  9.19   Sodium 135 - 145 mmol/L 139  136  135   Potassium 3.5 - 5.1 mmol/L 3.5  4.1  3.7   Chloride 98 - 111 mmol/L 107  104  103   CO2 22 - 32 mmol/L 24  18  23    Calcium 8.9 - 10.3  mg/dL 8.8  8.9  8.1    ECHOCARDIOGRAM COMPLETE Result Date: 04/11/2023    ECHOCARDIOGRAM REPORT   Patient Name:   Sydney Collins Date of Exam: 04/11/2023 Medical Rec #:  [id number]       Height:       63.0 in Accession #:    [id number]      Weight:       238.0 lb Date of Birth:  November 28, 1960        BSA:          2.082 m Patient Age:    62 years        BP:           143/75 mmHg Patient Gender: F               HR:           81 bpm. Exam Location:  [facility] Procedure: 2D Echo Indications:     CHF I50.31  History:         Patient has no prior history of Echocardiogram examinations.  Sonographer:     Thedora Louder RDCS, FASE Referring Phys:  [id number] CARALYN HUDSON Diagnosing Phys: Cara JONETTA Lovelace MD  Sonographer Comments: Technically difficult study due to poor echo windows and suboptimal apical  window. Image acquisition challenging due to respiratory motion. IMPRESSIONS  1. Left ventricular ejection fraction, by estimation, is 55 to 60%. The left ventricle has normal function. The left ventricle has no regional wall motion abnormalities. Left ventricular diastolic parameters were normal.  2. Right ventricular systolic function is normal. The right ventricular size is normal.  3. The mitral valve is normal in structure. Trivial mitral valve regurgitation.  4. The aortic valve is normal in structure. Aortic valve regurgitation is not visualized. FINDINGS  Left Ventricle: Left ventricular ejection fraction, by estimation, is 55 to 60%. The left ventricle has normal function. The left ventricle has no regional wall motion abnormalities. The left ventricular internal cavity size was normal in size. There is  borderline left ventricular hypertrophy. Left ventricular diastolic parameters were normal. Right Ventricle: The right ventricular size is normal. No increase in right ventricular wall thickness. Right ventricular systolic function is normal. Left Atrium: Left atrial size was normal in size. Right Atrium: Right atrial size was normal in size. Pericardium: There is no evidence of pericardial effusion. Mitral Valve: The mitral valve is normal in structure. Trivial mitral valve regurgitation. Tricuspid Valve: The tricuspid valve is normal in structure. Tricuspid valve regurgitation is trivial. Aortic Valve: The aortic valve is normal in structure. Aortic valve regurgitation is not visualized. Aortic valve peak gradient measures 12.2 mmHg. Pulmonic Valve: The pulmonic valve was grossly normal. Pulmonic valve regurgitation is not visualized. Aorta: The ascending aorta was not well visualized. IAS/Shunts: No atrial level shunt detected by color flow Doppler.  LEFT VENTRICLE PLAX 2D LVIDd:         4.10 cm   Diastology LVIDs:         2.90 cm   LV e' medial:    10.00 cm/s LV PW:         1.10 cm   LV E/e' medial:   9.8 LV IVS:        1.20 cm   LV e' lateral:   8.27 cm/s LVOT diam:     2.00 cm   LV E/e' lateral: 11.9 LV SV:         90 LV SV Index:   43 LVOT Area:  3.14 cm  RIGHT VENTRICLE RV Basal diam:  3.50 cm TAPSE (M-mode): 2.5 cm LEFT ATRIUM             Index        RIGHT ATRIUM           Index LA diam:        4.90 cm 2.35 cm/m   RA Area:     25.00 cm LA Vol (A2C):   83.4 ml 40.06 ml/m  RA Volume:   84.80 ml  40.73 ml/m LA Vol (A4C):   90.5 ml 43.47 ml/m LA Biplane Vol: 92.5 ml 44.43 ml/m  AORTIC VALVE                 PULMONIC VALVE AV Area (Vmax): 2.37 cm     PV Vmax:        1.22 m/s AV Vmax:        175.00 cm/s  PV Peak grad:   6.0 mmHg AV Peak Grad:   12.2 mmHg    RVOT Peak grad: 4 mmHg LVOT Vmax:      132.00 cm/s LVOT Vmean:     92.400 cm/s LVOT VTI:       0.288 m  AORTA Ao Root diam: 2.90 cm Ao Asc diam:  2.80 cm MITRAL VALVE               TRICUSPID VALVE MV Area (PHT): 5.34 cm    TR Peak grad:   31.8 mmHg MV Decel Time: 142 msec    TR Vmax:        282.00 cm/s MV E velocity: 98.50 cm/s MV A velocity: 95.40 cm/s  SHUNTS MV E/A ratio:  1.03        Systemic VTI:  0.29 m                            Systemic Diam: 2.00 cm Cara JONETTA Lovelace MD Electronically signed by Cara JONETTA Lovelace MD Signature Date/Time: 04/11/2023/2:52:38 PM    Final    DG Chest 2 View Result Date: 04/09/2023 CLINICAL DATA:  Shortness of breath for 1 month. 20 pound weight gain over 6 weeks. Decreased oxygen saturation. EXAM: CHEST - 2 VIEW COMPARISON:  11/05/2019 FINDINGS: Cardiac enlargement. Mild central vascular congestion. Slight interstitial change in the left base may represent early edema. This is progressing since prior study. Small bilateral pleural effusions. No pneumothorax. Esophageal hiatal hernia behind the heart. IMPRESSION: Cardiac enlargement with mild vascular congestion, small pleural effusions, and possible early interstitial edema. Moderate-sized esophageal hiatal hernia behind the heart. Electronically Signed   By:  Elsie Gravely M.D.   On: 04/09/2023 18:55   Family Communication: Discussed with patient, she understand and agree. All questions answereed.  Disposition: Status is: Inpatient Remains inpatient appropriate because: Severe anemia, CHF will need workup.  Planned Discharge Destination: Home with Home Health     Time spent: 42 minutes  Author: Concepcion Riser, MD 04/11/2023 4:12 PM Secure chat 7am to 7pm For on call review [email or web address].

## 2023-04-11 NOTE — Progress Notes (Signed)
  Echocardiogram 2D Echocardiogram has been performed.  Sydney Collins 04/11/2023, 12:41 PM

## 2023-04-12 DIAGNOSIS — I5031 Acute diastolic (congestive) heart failure: Secondary | ICD-10-CM | POA: Diagnosis not present

## 2023-04-12 DIAGNOSIS — D508 Other iron deficiency anemias: Secondary | ICD-10-CM | POA: Diagnosis not present

## 2023-04-12 DIAGNOSIS — K219 Gastro-esophageal reflux disease without esophagitis: Secondary | ICD-10-CM | POA: Diagnosis not present

## 2023-04-12 DIAGNOSIS — D649 Anemia, unspecified: Secondary | ICD-10-CM | POA: Diagnosis not present

## 2023-04-12 LAB — BPAM RBC
Blood Product Expiration Date: 202503092359
Blood Product Expiration Date: 202503092359
Blood Product Expiration Date: 202503092359
Blood Product Expiration Date: 202503132359
ISSUE DATE / TIME: 202502062128
ISSUE DATE / TIME: 202502070031
ISSUE DATE / TIME: 202502071349
ISSUE DATE / TIME: 202502080050
Unit Type and Rh: 202503132359
Unit Type and Rh: 6200
Unit Type and Rh: 6200
Unit Type and Rh: 6200
Unit Type and Rh: 6200

## 2023-04-12 LAB — TYPE AND SCREEN
ABO/RH(D): A POS
Antibody Screen: NEGATIVE
Unit division: 0
Unit division: 0
Unit division: 0
Unit division: 0

## 2023-04-12 LAB — CBC
HCT: 36.3 % (ref 36.0–46.0)
Hemoglobin: 10.3 g/dL — ABNORMAL LOW (ref 12.0–15.0)
MCH: 22.7 pg — ABNORMAL LOW (ref 26.0–34.0)
MCHC: 28.4 g/dL — ABNORMAL LOW (ref 30.0–36.0)
MCV: 80 fL (ref 80.0–100.0)
Platelets: 458 10*3/uL — ABNORMAL HIGH (ref 150–400)
RBC: 4.54 MIL/uL (ref 3.87–5.11)
RDW: 24.5 % — ABNORMAL HIGH (ref 11.5–15.5)
WBC: 5.9 10*3/uL (ref 4.0–10.5)
nRBC: 1.9 % — ABNORMAL HIGH (ref 0.0–0.2)

## 2023-04-12 LAB — BASIC METABOLIC PANEL
Anion gap: 12 (ref 5–15)
BUN: 19 mg/dL (ref 8–23)
CO2: 25 mmol/L (ref 22–32)
Calcium: 9.1 mg/dL (ref 8.9–10.3)
Chloride: 100 mmol/L (ref 98–111)
Creatinine, Ser: 0.65 mg/dL (ref 0.44–1.00)
GFR, Estimated: >60 mL/min (ref >60)
Glucose, Bld: 91 mg/dL (ref 70–99)
Potassium: 3.8 mmol/L (ref 3.5–5.1)
Sodium: 137 mmol/L (ref 135–145)

## 2023-04-12 LAB — TISSUE TRANSGLUTAMINASE, IGA: Tissue Transglutaminase Ab, IgA: <2 U/mL (ref 0–3)

## 2023-04-12 MED ORDER — ACETAMINOPHEN 500 MG PO TABS: 500.0000 mg | ORAL_TABLET | Freq: Four times a day (QID) | ORAL | 0 refills | Status: AC | PRN

## 2023-04-12 MED ORDER — FUROSEMIDE 20 MG PO TABS: 20.0000 mg | ORAL_TABLET | Freq: Two times a day (BID) | ORAL | 2 refills | Status: DC

## 2023-04-12 MED ORDER — FUROSEMIDE 20 MG PO TABS: 20.0000 mg | ORAL_TABLET | Freq: Two times a day (BID) | ORAL | Status: DC

## 2023-04-12 MED ORDER — FERROUS SULFATE 325 (65 FE) MG PO TABS: 325.0000 mg | ORAL_TABLET | Freq: Two times a day (BID) | ORAL | Status: DC

## 2023-04-12 MED ORDER — FERROUS SULFATE 325 (65 FE) MG PO TABS: 325.0000 mg | ORAL_TABLET | Freq: Two times a day (BID) | ORAL | 3 refills | Status: AC

## 2023-04-12 NOTE — Progress Notes (Signed)
 Patient ID: Sydney Collins, female   DOB: Sep 24, 1960, 63 y.o.   MRN: [id number] [facility]    SUBJECTIVE: Resting comfortably reduced shortness of breath improved since transfusion denies any overt blood loss no chest pain denies palpitations or tachycardia   Vitals:   04/12/23 0344 04/12/23 0512 04/12/23 0810 04/12/23 1112  BP: (!) 147/71  126/66 121/64  Pulse: 68  69 71  Resp: 18  16 17   Temp: 97.8 F (36.6 C)  98 F (36.7 C) 98.1 F (36.7 C)  TempSrc:   Oral Oral  SpO2: 100%  100% 100%  Weight:  105 kg       Intake/Output Summary (Last 24 hours) at 04/12/2023 1308 Last data filed at 04/12/2023 1100 Gross per 24 hour  Intake 240 ml  Output 100 ml  Net 140 ml      PHYSICAL EXAM  General: Well developed, well nourished, in no acute distress HEENT:  Normocephalic and atramatic Neck:  No JVD.  Lungs: Clear bilaterally to auscultation and percussion. Heart: HRRR . Normal S1 and S2 without gallops or murmurs.  Abdomen: Bowel sounds are positive, abdomen soft and non-tender  Msk:  Back normal, normal gait. Normal strength and tone for age. Extremities: No clubbing, cyanosis or edema.   Neuro: Alert and oriented X 3. Psych:  Good affect, responds appropriately   LABS: Basic Metabolic Panel: Recent Labs    04/10/23 0515 04/12/23 0823  NA 139 137  K 3.5 3.8  CL 107 100  CO2 24 25  GLUCOSE 100* 91  BUN 17 19  CREATININE 0.59 0.65  CALCIUM 8.8* 9.1   Liver Function Tests: Recent Labs    04/09/23 1951  AST 24  ALT 23  ALKPHOS 56  BILITOT 0.6  PROT 7.1  ALBUMIN 3.8   No results for input(s): LIPASE, AMYLASE in the last 72 hours. CBC: Recent Labs    04/10/23 0626 04/10/23 1110 04/10/23 2300 04/11/23 0654 04/12/23 0823  WBC 7.1  --   --   --  5.9  HGB 5.8* 6.1*   < > 8.8* 10.3*  HCT 20.6* 21.6*  --   --  36.3  MCV 73.8*  --   --   --  80.0  PLT 182  --   --   --  458*   < > = values in this interval not displayed.   Cardiac Enzymes: No  results for input(s): CKTOTAL, CKMB, CKMBINDEX, TROPONINI in the last 72 hours. BNP: Invalid input(s): POCBNP D-Dimer: No results for input(s): DDIMER in the last 72 hours. Hemoglobin A1C: No results for input(s): HGBA1C in the last 72 hours. Fasting Lipid Panel: No results for input(s): CHOL, HDL, LDLCALC, TRIG, CHOLHDL, LDLDIRECT in the last 72 hours. Thyroid Function Tests: No results for input(s): TSH, T4TOTAL, T3FREE, THYROIDAB in the last 72 hours.  Invalid input(s): FREET3 Anemia Panel: Recent Labs    04/09/23 1830 04/10/23 0047  VITAMINB12  --  218  FOLATE  --  12.6  FERRITIN  --  2*  TIBC  --  521*  IRON   --  21*  RETICCTPCT 3.8*  --     ECHOCARDIOGRAM COMPLETE Result Date: 04/11/2023    ECHOCARDIOGRAM REPORT   Patient Name:   TENEIL SHILLER Date of Exam: 04/11/2023 Medical Rec #:  [id number]       Height:       63.0 in Accession #:    [id number]      Weight:  238.0 lb Date of Birth:  [birth year]        BSA:          2.082 m Patient Age:    62 years        BP:           143/75 mmHg Patient Gender: F               HR:           81 bpm. Exam Location:  [facility] Procedure: 2D Echo Indications:     CHF I50.31  History:         Patient has no prior history of Echocardiogram examinations.  Sonographer:     Thedora Louder RDCS, FASE Referring Phys:  [id number] CARALYN HUDSON Diagnosing Phys: Cara JONETTA Lovelace MD  Sonographer Comments: Technically difficult study due to poor echo windows and suboptimal apical window. Image acquisition challenging due to respiratory motion. IMPRESSIONS  1. Left ventricular ejection fraction, by estimation, is 55 to 60%. The left ventricle has normal function. The left ventricle has no regional wall motion abnormalities. Left ventricular diastolic parameters were normal.  2. Right ventricular systolic function is normal. The right ventricular size is normal.  3. The mitral valve is normal in structure. Trivial mitral valve  regurgitation.  4. The aortic valve is normal in structure. Aortic valve regurgitation is not visualized. FINDINGS  Left Ventricle: Left ventricular ejection fraction, by estimation, is 55 to 60%. The left ventricle has normal function. The left ventricle has no regional wall motion abnormalities. The left ventricular internal cavity size was normal in size. There is  borderline left ventricular hypertrophy. Left ventricular diastolic parameters were normal. Right Ventricle: The right ventricular size is normal. No increase in right ventricular wall thickness. Right ventricular systolic function is normal. Left Atrium: Left atrial size was normal in size. Right Atrium: Right atrial size was normal in size. Pericardium: There is no evidence of pericardial effusion. Mitral Valve: The mitral valve is normal in structure. Trivial mitral valve regurgitation. Tricuspid Valve: The tricuspid valve is normal in structure. Tricuspid valve regurgitation is trivial. Aortic Valve: The aortic valve is normal in structure. Aortic valve regurgitation is not visualized. Aortic valve peak gradient measures 12.2 mmHg. Pulmonic Valve: The pulmonic valve was grossly normal. Pulmonic valve regurgitation is not visualized. Aorta: The ascending aorta was not well visualized. IAS/Shunts: No atrial level shunt detected by color flow Doppler.  LEFT VENTRICLE PLAX 2D LVIDd:         4.10 cm   Diastology LVIDs:         2.90 cm   LV e' medial:    10.00 cm/s LV PW:         1.10 cm   LV E/e' medial:  9.8 LV IVS:        1.20 cm   LV e' lateral:   8.27 cm/s LVOT diam:     2.00 cm   LV E/e' lateral: 11.9 LV SV:         90 LV SV Index:   43 LVOT Area:     3.14 cm  RIGHT VENTRICLE RV Basal diam:  3.50 cm TAPSE (M-mode): 2.5 cm LEFT ATRIUM             Index        RIGHT ATRIUM           Index LA diam:        4.90 cm 2.35 cm/m   RA Area:  25.00 cm LA Vol (A2C):   83.4 ml 40.06 ml/m  RA Volume:   84.80 ml  40.73 ml/m LA Vol (A4C):   90.5 ml 43.47  ml/m LA Biplane Vol: 92.5 ml 44.43 ml/m  AORTIC VALVE                 PULMONIC VALVE AV Area (Vmax): 2.37 cm     PV Vmax:        1.22 m/s AV Vmax:        175.00 cm/s  PV Peak grad:   6.0 mmHg AV Peak Grad:   12.2 mmHg    RVOT Peak grad: 4 mmHg LVOT Vmax:      132.00 cm/s LVOT Vmean:     92.400 cm/s LVOT VTI:       0.288 m  AORTA Ao Root diam: 2.90 cm Ao Asc diam:  2.80 cm MITRAL VALVE               TRICUSPID VALVE MV Area (PHT): 5.34 cm    TR Peak grad:   31.8 mmHg MV Decel Time: 142 msec    TR Vmax:        282.00 cm/s MV E velocity: 98.50 cm/s MV A velocity: 95.40 cm/s  SHUNTS MV E/A ratio:  1.03        Systemic VTI:  0.29 m                            Systemic Diam: 2.00 cm Maysel Mccolm D Zinia Innocent MD Electronically signed by Cara JONETTA Lovelace MD Signature Date/Time: 04/11/2023/2:52:38 PM    Final      Echo preserved left ventricular function EF of at least 60%  TELEMETRY: Normal sinus rhythm rate of 80 nonspecific ST-T wave changes:  ASSESSMENT AND PLAN:  Principal Problem:   Symptomatic anemia Active Problems:   GERD without esophagitis   Acute CHF (HCC)   Iron  deficiency anemia     Plan Shortness of breath evidence of heart failure poss related to anemia agree with transfusion maintain hemoglobin 7-8 Shortness of breath dyspnea volume overload continue diuretic therapy to help with dyspnea Obesity recommend modest weight loss exercise portion Echocardiogram with preserved left ventricular function continue conservative management GERD recommend PPI in addition to Pepcid  consider GI follow-up Continue profound anemia workup including sources of bleeding consider hematology involvement Have the patient follow-up with cardiology 1 to 2 weeks   Cara JONETTA Lovelace, MD, 04/12/2023 1:08 PM

## 2023-04-12 NOTE — Plan of Care (Signed)
  Problem: Education: Goal: Ability to verbalize understanding of medication therapies will improve Outcome: Progressing   Problem: Activity: Goal: Capacity to carry out activities will improve Outcome: Progressing   Problem: Cardiac: Goal: Ability to achieve and maintain adequate cardiopulmonary perfusion will improve Outcome: Progressing   Problem: Health Behavior/Discharge Planning: Goal: Ability to manage health-related needs will improve Outcome: Progressing   Problem: Nutrition: Goal: Adequate nutrition will be maintained Outcome: Progressing

## 2023-04-12 NOTE — Discharge Summary (Signed)
 Physician Discharge Summary   Patient: Sydney Collins MRN: [id number] DOB: [birth year]  Admit date:     04/09/2023  Discharge date: 04/12/23  Discharge Physician: Concepcion Riser   PCP: [facility]   Recommendations at discharge:   PCP follow up in 1 week Cardiology follow up as scheduled. GI follow up suggested for anemia work up.  Discharge Diagnoses: Principal Problem:   Symptomatic anemia Active Problems:   Acute CHF (HCC)   GERD without esophagitis   Iron  deficiency anemia  Resolved Problems:   * No resolved hospital problems. *  Hospital Course: Sydney Collins is 63 year old Caucasian female with a history of sleep apnea, anemia, GERD, obesity, chronic right upper sided abdominal discomfort who presented to the emergency department with worsening shortness of breath, swelling and weight gain.    Patient was hypoxic in the emergency department requiring 2 L supplemental oxygen.  CBC showed hemoglobin 4.3, BNP 500, chest x-ray showed mild vascular congestion.  She is admitted for severe anemia, acute CHF.  Assessment and Plan: * Symptomatic anemia Severe anemia with Hb 4.3 on presentation. No active bleeding. GI consulted who recommended continue to transfuse to keep hemoglobin greater than 7, outpatient GI scopes once cardiology clears her. Continue to monitor H&H closely. Total 4 units of PRBC given hemoglobin today at 10.3. Her iron  level is low, iron  supplements prescribed. I advised her to follow-up with GI for anemia workup. Patient understands to follow-up with PCP, cardiology, GI upon discharge.   Acute diastolic CHF (HCC) 2D echocardiogram reviewed, EF 55-60%, no wall motion abnormalities. This could be high-output failure due to severe anemia. Cardiology follow up appreciated.  Advised Lasix  20 mg twice daily and outpatient cardiology follow-up. Advised fluid, salt restriction.   Morbid obesity BMI 43.58 Diet, exercise and weight  reduction advised. Sleep study outpatient advised.       Consultants: Cardiology, GI Procedures performed: None Disposition: Home Diet recommendation:  Discharge Diet Orders (From admission, onward)     Start     Ordered   04/12/23 0000  Diet - low sodium heart healthy        04/12/23 1255           Cardiac diet DISCHARGE MEDICATION: Allergies as of 04/12/2023       Reactions   Latex Other (See Comments)   blisters        Medication List     STOP taking these medications    ibuprofen 800 MG tablet Commonly known as: ADVIL   ondansetron  4 MG disintegrating tablet Commonly known as: Zofran  ODT   oxyCODONE  5 MG immediate release tablet Commonly known as: Oxy IR/ROXICODONE    prednisoLONE -Moxifloxacin  1-0.5 % Soln   predniSONE  20 MG tablet Commonly known as: DELTASONE        TAKE these medications    acetaminophen  500 MG tablet Commonly known as: TYLENOL  Take 1 tablet (500 mg total) by mouth every 6 (six) hours as needed for moderate pain (pain score 4-6) or headache. What changed: how much to take   ferrous sulfate  325 (65 FE) MG tablet Take 1 tablet (325 mg total) by mouth 2 (two) times daily with a meal.   furosemide  20 MG tablet Commonly known as: LASIX  Take 1 tablet (20 mg total) by mouth 2 (two) times daily.   Melatonin 10 MG Tabs Take 1 tablet by mouth at bedtime.        Follow-up Information     Alluri, Keller BROCKS, MD. Go in 1 week(s).  Specialty: Cardiology Contact information: [address] [place] [address] [phone]         [facility] Follow up in 1 week(s).   Contact information: [address] [place] [address] [phone]         Therisa Bi, MD Follow up in 3 week(s).   Specialty: Gastroenterology Contact information: [address] [place] [address] [phone]                Discharge Exam: Sydney Collins   04/09/23 1819 04/11/23 0500  04/12/23 0512  Weight: 111.6 kg 108 kg 105 kg   Vitals:   04/12/23 1112 04/12/23 1321  BP: 121/64   Pulse: 71   Resp: 17   Temp: 98.1 F (36.7 C)   SpO2: 100% 96%    General - Middle aged obese Caucasian female, no respiratory distress HEENT - PERRLA, EOMI, atraumatic head, non tender sinuses. Lung - Clear, diffuse rales, rhonchi, no wheezes. Heart - S1, S2 heard, no murmurs, rubs, trace pitting pedal edema. Abdomen - Soft, non tender, no guarding, bowel sounds good Neuro - Alert, awake and oriented x 3, non focal exam. Skin - Warm and dry.  Condition at discharge: stable  The results of significant diagnostics from this hospitalization (including imaging, microbiology, ancillary and laboratory) are listed below for reference.   Imaging Studies: ECHOCARDIOGRAM COMPLETE Result Date: 04/11/2023    ECHOCARDIOGRAM REPORT   Patient Name:   Sydney Collins Date of Exam: 04/11/2023 Medical Rec #:  [id number]       Height:       63.0 in Accession #:    [id number]      Weight:       238.0 lb Date of Birth:  [birth year]        BSA:          2.082 m Patient Age:    63 years        BP:           143/75 mmHg Patient Gender: F               HR:           81 bpm. Exam Location:  [facility] Procedure: 2D Echo Indications:     CHF I50.31  History:         Patient has no prior history of Echocardiogram examinations.  Sonographer:     Thedora Louder RDCS, FASE Referring Phys:  [id number] CARALYN HUDSON Diagnosing Phys: Cara JONETTA Lovelace MD  Sonographer Comments: Technically difficult study due to poor echo windows and suboptimal apical window. Image acquisition challenging due to respiratory motion. IMPRESSIONS  1. Left ventricular ejection fraction, by estimation, is 55 to 60%. The left ventricle has normal function. The left ventricle has no regional wall motion abnormalities. Left ventricular diastolic parameters were normal.  2. Right ventricular systolic function is normal. The right ventricular size is normal.  3.  The mitral valve is normal in structure. Trivial mitral valve regurgitation.  4. The aortic valve is normal in structure. Aortic valve regurgitation is not visualized. FINDINGS  Left Ventricle: Left ventricular ejection fraction, by estimation, is 55 to 60%. The left ventricle has normal function. The left ventricle has no regional wall motion abnormalities. The left ventricular internal cavity size was normal in size. There is  borderline left ventricular hypertrophy. Left ventricular diastolic parameters were normal. Right Ventricle: The right ventricular size is normal. No increase in right ventricular  wall thickness. Right ventricular systolic function is normal. Left Atrium: Left atrial size was normal in size. Right Atrium: Right atrial size was normal in size. Pericardium: There is no evidence of pericardial effusion. Mitral Valve: The mitral valve is normal in structure. Trivial mitral valve regurgitation. Tricuspid Valve: The tricuspid valve is normal in structure. Tricuspid valve regurgitation is trivial. Aortic Valve: The aortic valve is normal in structure. Aortic valve regurgitation is not visualized. Aortic valve peak gradient measures 12.2 mmHg. Pulmonic Valve: The pulmonic valve was grossly normal. Pulmonic valve regurgitation is not visualized. Aorta: The ascending aorta was not well visualized. IAS/Shunts: No atrial level shunt detected by color flow Doppler.  LEFT VENTRICLE PLAX 2D LVIDd:         4.10 cm   Diastology LVIDs:         2.90 cm   LV e' medial:    10.00 cm/s LV PW:         1.10 cm   LV E/e' medial:  9.8 LV IVS:        1.20 cm   LV e' lateral:   8.27 cm/s LVOT diam:     2.00 cm   LV E/e' lateral: 11.9 LV SV:         90 LV SV Index:   43 LVOT Area:     3.14 cm  RIGHT VENTRICLE RV Basal diam:  3.50 cm TAPSE (M-mode): 2.5 cm LEFT ATRIUM             Index        RIGHT ATRIUM           Index LA diam:        4.90 cm 2.35 cm/m   RA Area:     25.00 cm LA Vol (A2C):   83.4 ml 40.06 ml/m  RA  Volume:   84.80 ml  40.73 ml/m LA Vol (A4C):   90.5 ml 43.47 ml/m LA Biplane Vol: 92.5 ml 44.43 ml/m  AORTIC VALVE                 PULMONIC VALVE AV Area (Vmax): 2.37 cm     PV Vmax:        1.22 m/s AV Vmax:        175.00 cm/s  PV Peak grad:   6.0 mmHg AV Peak Grad:   12.2 mmHg    RVOT Peak grad: 4 mmHg LVOT Vmax:      132.00 cm/s LVOT Vmean:     92.400 cm/s LVOT VTI:       0.288 m  AORTA Ao Root diam: 2.90 cm Ao Asc diam:  2.80 cm MITRAL VALVE               TRICUSPID VALVE MV Area (PHT): 5.34 cm    TR Peak grad:   31.8 mmHg MV Decel Time: 142 msec    TR Vmax:        282.00 cm/s MV E velocity: 98.50 cm/s MV A velocity: 95.40 cm/s  SHUNTS MV E/A ratio:  1.03        Systemic VTI:  0.29 m                            Systemic Diam: 2.00 cm Cara JONETTA Lovelace MD Electronically signed by Cara JONETTA Lovelace MD Signature Date/Time: 04/11/2023/2:52:38 PM    Final    DG Chest 2 View Result Date: 04/09/2023 CLINICAL DATA:  Shortness  of breath for 1 month. 20 pound weight gain over 6 weeks. Decreased oxygen saturation. EXAM: CHEST - 2 VIEW COMPARISON:  11/05/2019 FINDINGS: Cardiac enlargement. Mild central vascular congestion. Slight interstitial change in the left base may represent early edema. This is progressing since prior study. Small bilateral pleural effusions. No pneumothorax. Esophageal hiatal hernia behind the heart. IMPRESSION: Cardiac enlargement with mild vascular congestion, small pleural effusions, and possible early interstitial edema. Moderate-sized esophageal hiatal hernia behind the heart. Electronically Signed   By: Elsie Gravely M.D.   On: 04/09/2023 18:55    Microbiology: No results found for this or any previous visit.  Labs: CBC: Recent Labs  Lab 04/09/23 1830 04/10/23 0626 04/10/23 1110 04/10/23 2300 04/11/23 0654 04/12/23 0823  WBC 7.8 7.1  --   --   --  5.9  HGB 4.3* 5.8* 6.1* 6.8* 8.8* 10.3*  HCT 18.0* 20.6* 21.6*  --   --  36.3  MCV 71.4* 73.8*  --   --   --  80.0  PLT 560*  182  --   --   --  458*   Basic Metabolic Panel: Recent Labs  Lab 04/09/23 1830 04/10/23 0515 04/12/23 0823  NA 136 139 137  K 4.1 3.5 3.8  CL 104 107 100  CO2 18* 24 25  GLUCOSE 129* 100* 91  BUN 18 17 19   CREATININE 0.54 0.59 0.65  CALCIUM 8.9 8.8* 9.1   Liver Function Tests: Recent Labs  Lab 04/09/23 1951  AST 24  ALT 23  ALKPHOS 56  BILITOT 0.6  PROT 7.1  ALBUMIN 3.8   CBG: No results for input(s): GLUCAP in the last 168 hours.  Discharge time spent: 33 minutes.  Signed: Concepcion Riser, MD [facility] 04/12/2023

## 2023-04-13 LAB — GLIADIN ANTIBODIES, SERUM
Antigliadin Abs, IgA: 3 U (ref 0–19)
Gliadin IgG: 2 U (ref 0–19)

## 2023-04-15 LAB — RETICULIN ANTIBODIES, IGA W TITER: Reticulin Ab, IgA: NEGATIVE {titer} (ref <2.5)

## 2023-04-17 ENCOUNTER — Inpatient Hospital Stay: Payer: Medicaid Other

## 2023-04-17 ENCOUNTER — Inpatient Hospital Stay: Payer: Medicaid Other | Attending: Internal Medicine | Admitting: Internal Medicine

## 2023-04-17 ENCOUNTER — Encounter: Payer: Self-pay | Admitting: Internal Medicine

## 2023-04-17 VITALS — BP 179/81 | HR 65 | Temp 97.1°F | Ht 63.0 in | Wt 234.8 lb

## 2023-04-17 DIAGNOSIS — E611 Iron deficiency: Secondary | ICD-10-CM | POA: Insufficient documentation

## 2023-04-17 DIAGNOSIS — D649 Anemia, unspecified: Secondary | ICD-10-CM

## 2023-04-17 DIAGNOSIS — I110 Hypertensive heart disease with heart failure: Secondary | ICD-10-CM | POA: Insufficient documentation

## 2023-04-17 DIAGNOSIS — R519 Headache, unspecified: Secondary | ICD-10-CM

## 2023-04-17 LAB — CBC WITH DIFFERENTIAL (CANCER CENTER ONLY)
Abs Immature Granulocytes: 0.02 10*3/uL (ref 0.00–0.07)
Basophils Absolute: 0.1 10*3/uL (ref 0.0–0.1)
Basophils Relative: 2 %
Eosinophils Absolute: 0.3 10*3/uL (ref 0.0–0.5)
Eosinophils Relative: 5 %
HCT: 34.6 % — ABNORMAL LOW (ref 36.0–46.0)
Hemoglobin: 9.6 g/dL — ABNORMAL LOW (ref 12.0–15.0)
Immature Granulocytes: 0 %
Lymphocytes Relative: 30 %
Lymphs Abs: 1.8 10*3/uL (ref 0.7–4.0)
MCH: 22.4 pg — ABNORMAL LOW (ref 26.0–34.0)
MCHC: 27.7 g/dL — ABNORMAL LOW (ref 30.0–36.0)
MCV: 80.7 fL (ref 80.0–100.0)
Monocytes Absolute: 0.5 10*3/uL (ref 0.1–1.0)
Monocytes Relative: 8 %
Neutro Abs: 3.3 10*3/uL (ref 1.7–7.7)
Neutrophils Relative %: 55 %
Platelet Count: 490 10*3/uL — ABNORMAL HIGH (ref 150–400)
RBC: 4.29 MIL/uL (ref 3.87–5.11)
RDW: 24.4 % — ABNORMAL HIGH (ref 11.5–15.5)
WBC Count: 5.9 10*3/uL (ref 4.0–10.5)
nRBC: 0 % (ref 0.0–0.2)

## 2023-04-17 LAB — LACTATE DEHYDROGENASE: LDH: 161 U/L (ref 98–192)

## 2023-04-17 NOTE — Progress Notes (Signed)
[facility] CONSULT NOTE  Patient Care Team: Inc, [facility] as PCP - General Donneta Romberg, Worthy Flank, MD as Consulting Physician (Oncology)  CHIEF COMPLAINTS/PURPOSE OF CONSULTATION: ANEMIA   HEMATOLOGY HISTORY  # ANEMIA[Hb; MCV-platelets- WBC; Iron sat; ferritin;  GFR- CT/US- ;    Latest Reference Range & Units Most Recent  Iron 28 - 170 ug/dL 21 (L) 04/03/28 86:57  UIBC ug/dL 846 11/07/27 52:84  TIBC 250 - 450 ug/dL 132 (H) 06/05/99 02:72  Saturation Ratios 10.4 - 31.8 % 4 (L) 04/10/23 00:47  Ferritin 11 - 307 ng/mL 2 (L) 04/10/23 00:47  Folate >5.9 ng/mL 12.6 04/10/23 00:47  (L): Data is abnormally low (H): Data is abnormally high  HISTORY OF PRESENTING ILLNESS: /.Patient ambulating-independently. Alone.  Sydney Collins 63 y.o.  female pleasant patient is  been referred to [place] for further evaluation of anemia.  Caucasian female with a history of sleep apnea, anemia, GERD, obesity, chronic right upper sided abdominal discomfort-was recently admitted to hospital for shortness of breath/CHF.  Noted to have hemoglobin of 4.3 on presentation.  Patient also status post evaluation with cardiology.  Patient status post 4 units of blood in the hospital.  Patient had followed up with GI outpatient.  Currently awaiting endoscopy in April after clearance with cardiology.  Patient states that she is on oral iron once a day.  Noted to have abdominal discomfort.   Patient was hypoxic in the emergency department requiring 2 L supplemental oxygen.  CBC showed hemoglobin 4.3, BNP 500, chest x-ray showed mild vascular congestion.  She is admitted for severe anemia, acute CHF.  Patient complains of ongoing generalized headaches.  No nausea no vomiting.  Intractable.  Mild improvement with Tylenol.   Blood in stools: EGD/colonoscopy- awaiting on April 3rd, 2025.  Blood in urine: none  Difficulty swallowing:none  Change of bowel movement/constipation:none  Prior blood  transfusion: 4 units PRBC- Jan 2025.  Kidney/Liver disease:none  Alcohol: none  Bariatric surgery:none   Vaginal bleeding: none  Prior evaluation with hematology:none  Prior bone marrow biopsy: none  Prior IV iron infusions: none    Review of Systems  Constitutional:  Positive for malaise/fatigue. Negative for chills, diaphoresis, fever and weight loss.  HENT:  Negative for nosebleeds and sore throat.   Eyes:  Negative for double vision.  Respiratory:  Positive for shortness of breath. Negative for cough, hemoptysis, sputum production and wheezing.   Cardiovascular:  Negative for chest pain, palpitations, orthopnea and leg swelling.  Gastrointestinal:  Negative for abdominal pain, blood in stool, constipation, diarrhea, heartburn, melena, nausea and vomiting.  Genitourinary:  Negative for dysuria, frequency and urgency.  Musculoskeletal:  Negative for back pain and joint pain.  Skin: Negative.  Negative for itching and rash.  Neurological:  Negative for dizziness, tingling, focal weakness, weakness and headaches.  Endo/Heme/Allergies:  Does not bruise/bleed easily.  Psychiatric/Behavioral:  Negative for depression. The patient is not nervous/anxious and does not have insomnia.      MEDICAL HISTORY:  Past Medical History:  Diagnosis Date   Anemia    H/O   Arthritis    Complication of anesthesia    could feel everything when she was being extubated   GERD (gastroesophageal reflux disease)    H/O   Hypertension    H/O OVER A SHORT PERIOD OF TIME- LOST WEIGHT AND IS NO LONGER TAKING BP MED PER PCP   Sleep apnea 1999   WAS TOLD SHE STOPPED BREATHING 80 TIMES DURING THE NIGHT.  NEVER WAS GIVEN CPAP MACHINE PER PT    SURGICAL HISTORY: Past Surgical History:  Procedure Laterality Date   APPENDECTOMY     SHOULDER ARTHROSCOPY Right 06/05/2015   Procedure: ARTHROSCOPY SHOULDER;  Surgeon: Christena Flake, MD;  Location: [facility] ORS;  Service: Orthopedics;  Laterality: Right;   SHOULDER  ARTHROSCOPY WITH DEBRIDEMENT AND BICEP TENDON REPAIR Right 05/13/2016   Procedure: SHOULDER ARTHROSCOPY WITH DEBRIDEMENT AND BICEP TENDON REPAIR;  Surgeon: Christena Flake, MD;  Location: [facility] ORS;  Service: Orthopedics;  Laterality: Right;   SHOULDER ARTHROSCOPY WITH OPEN ROTATOR CUFF REPAIR Right 05/13/2016   Procedure: SHOULDER ARTHROSCOPY WITH OPEN ROTATOR CUFF REPAIR;  Surgeon: Christena Flake, MD;  Location: [facility] ORS;  Service: Orthopedics;  Laterality: Right;   SHOULDER ARTHROSCOPY WITH OPEN ROTATOR CUFF REPAIR Right 03/17/2017   Procedure: SHOULDER ARTHROSCOPY WITH OPEN ROTATOR CUFF REPAIR;  Surgeon: Christena Flake, MD;  Location: [facility] ORS;  Service: Orthopedics;  Laterality: Right;  arthroscopic debridement    SINUS IRRIGATION      SOCIAL HISTORY: Social History   Socioeconomic History   Marital status: Legally Separated    Spouse name: Not on file   Number of children: Not on file   Years of education: Not on file   Highest education level: Not on file  Occupational History   Not on file  Tobacco Use   Smoking status: Never   Smokeless tobacco: Never  Vaping Use   Vaping status: Never Used  Substance and Sexual Activity   Alcohol use: No   Drug use: No   Sexual activity: Yes  Other Topics Concern   Not on file  Social History Narrative   Not on file   Social Drivers of Health   Financial Resource Strain: Not on file  Food Insecurity: Food Insecurity Present (04/17/2023)   Hunger Vital Sign    Worried About Running Out of Food in the Last Year: Never true    Ran Out of Food in the Last Year: Sometimes true  Transportation Needs: Unmet Transportation Needs (04/17/2023)   PRAPARE - Administrator, Civil Service (Medical): Yes    Lack of Transportation (Non-Medical): Yes  Physical Activity: Not on file  Stress: Not on file  Social Connections: Not on file  Intimate Partner Violence: Not At Risk (04/17/2023)   Humiliation, Afraid, Rape, and Kick questionnaire     Fear of Current or Ex-Partner: No    Emotionally Abused: No    Physically Abused: No    Sexually Abused: No    FAMILY HISTORY: Family History  Problem Relation Age of Onset   Multiple myeloma Mother    Heart attack Father     ALLERGIES:  is allergic to latex.  MEDICATIONS:  Current Outpatient Medications  Medication Sig Dispense Refill   acetaminophen (TYLENOL) 500 MG tablet Take 1 tablet (500 mg total) by mouth every 6 (six) hours as needed for moderate pain (pain score 4-6) or headache. 30 tablet 0   ferrous sulfate 325 (65 FE) MG tablet Take 1 tablet (325 mg total) by mouth 2 (two) times daily with a meal. 60 tablet 3   furosemide (LASIX) 20 MG tablet Take 1 tablet (20 mg total) by mouth 2 (two) times daily. 60 tablet 2   No current facility-administered medications for this visit.     PHYSICAL EXAMINATION:   Vitals:   04/17/23 1401  BP: (!) 179/81  Pulse: 65  Temp: (!) 97.1 F (36.2 C)  SpO2: 100%  Filed Weights   04/17/23 1401  Weight: 234 lb 12.8 oz (106.5 kg)    Physical Exam Vitals and nursing note reviewed.  HENT:     Head: Normocephalic and atraumatic.     Mouth/Throat:     Pharynx: Oropharynx is clear.  Eyes:     Extraocular Movements: Extraocular movements intact.     Pupils: Pupils are equal, round, and reactive to light.  Cardiovascular:     Rate and Rhythm: Normal rate and regular rhythm.  Pulmonary:     Comments: Decreased breath sounds bilaterally.  Abdominal:     Palpations: Abdomen is soft.  Musculoskeletal:        General: Normal range of motion.     Cervical back: Normal range of motion.  Skin:    General: Skin is warm.  Neurological:     General: No focal deficit present.     Mental Status: She is alert and oriented to person, place, and time.  Psychiatric:        Behavior: Behavior normal.        Judgment: Judgment normal.      LABORATORY DATA:  I have reviewed the data as listed Lab Results  Component Value Date   WBC  5.9 04/17/2023   HGB 9.6 (L) 04/17/2023   HCT 34.6 (L) 04/17/2023   MCV 80.7 04/17/2023   PLT 490 (H) 04/17/2023   Recent Labs    04/09/23 1830 04/09/23 1951 04/10/23 0515 04/12/23 0823  NA 136  --  139 137  K 4.1  --  3.5 3.8  CL 104  --  107 100  CO2 18*  --  24 25  GLUCOSE 129*  --  100* 91  BUN 18  --  17 19  CREATININE 0.54  --  0.59 0.65  CALCIUM 8.9  --  8.8* 9.1  GFRNONAA >60  --  >60 >60  PROT  --  7.1  --   --   ALBUMIN  --  3.8  --   --   AST  --  24  --   --   ALT  --  23  --   --   ALKPHOS  --  56  --   --   BILITOT  --  0.6  --   --   BILIDIR  --  <0.1  --   --   IBILI  --  NOT CALCULATED  --   --      ECHOCARDIOGRAM COMPLETE Result Date: 04/11/2023    ECHOCARDIOGRAM REPORT   Patient Name:   JOLINDA PINKSTAFF Date of Exam: 04/11/2023 Medical Rec #:  [id number]       Height:       63.0 in Accession #:    [id number]      Weight:       238.0 lb Date of Birth:  1961/01/29        BSA:          2.082 m Patient Age:    62 years        BP:           143/75 mmHg Patient Gender: F               HR:           81 bpm. Exam Location:  [facility] Procedure: 2D Echo Indications:     CHF I50.31  History:         Patient has no prior history of  Echocardiogram examinations.  Sonographer:     Overton Mam RDCS, FASE Referring Phys:  [id number] Dorian Pod HUDSON Diagnosing Phys: Alwyn Pea MD  Sonographer Comments: Technically difficult study due to poor echo windows and suboptimal apical window. Image acquisition challenging due to respiratory motion. IMPRESSIONS  1. Left ventricular ejection fraction, by estimation, is 55 to 60%. The left ventricle has normal function. The left ventricle has no regional wall motion abnormalities. Left ventricular diastolic parameters were normal.  2. Right ventricular systolic function is normal. The right ventricular size is normal.  3. The mitral valve is normal in structure. Trivial mitral valve regurgitation.  4. The aortic valve is normal in structure.  Aortic valve regurgitation is not visualized. FINDINGS  Left Ventricle: Left ventricular ejection fraction, by estimation, is 55 to 60%. The left ventricle has normal function. The left ventricle has no regional wall motion abnormalities. The left ventricular internal cavity size was normal in size. There is  borderline left ventricular hypertrophy. Left ventricular diastolic parameters were normal. Right Ventricle: The right ventricular size is normal. No increase in right ventricular wall thickness. Right ventricular systolic function is normal. Left Atrium: Left atrial size was normal in size. Right Atrium: Right atrial size was normal in size. Pericardium: There is no evidence of pericardial effusion. Mitral Valve: The mitral valve is normal in structure. Trivial mitral valve regurgitation. Tricuspid Valve: The tricuspid valve is normal in structure. Tricuspid valve regurgitation is trivial. Aortic Valve: The aortic valve is normal in structure. Aortic valve regurgitation is not visualized. Aortic valve peak gradient measures 12.2 mmHg. Pulmonic Valve: The pulmonic valve was grossly normal. Pulmonic valve regurgitation is not visualized. Aorta: The ascending aorta was not well visualized. IAS/Shunts: No atrial level shunt detected by color flow Doppler.  LEFT VENTRICLE PLAX 2D LVIDd:         4.10 cm   Diastology LVIDs:         2.90 cm   LV e' medial:    10.00 cm/s LV PW:         1.10 cm   LV E/e' medial:  9.8 LV IVS:        1.20 cm   LV e' lateral:   8.27 cm/s LVOT diam:     2.00 cm   LV E/e' lateral: 11.9 LV SV:         90 LV SV Index:   43 LVOT Area:     3.14 cm  RIGHT VENTRICLE RV Basal diam:  3.50 cm TAPSE (M-mode): 2.5 cm LEFT ATRIUM             Index        RIGHT ATRIUM           Index LA diam:        4.90 cm 2.35 cm/m   RA Area:     25.00 cm LA Vol (A2C):   83.4 ml 40.06 ml/m  RA Volume:   84.80 ml  40.73 ml/m LA Vol (A4C):   90.5 ml 43.47 ml/m LA Biplane Vol: 92.5 ml 44.43 ml/m  AORTIC VALVE                  PULMONIC VALVE AV Area (Vmax): 2.37 cm     PV Vmax:        1.22 m/s AV Vmax:        175.00 cm/s  PV Peak grad:   6.0 mmHg AV Peak Grad:   12.2 mmHg    RVOT Peak  grad: 4 mmHg LVOT Vmax:      132.00 cm/s LVOT Vmean:     92.400 cm/s LVOT VTI:       0.288 m  AORTA Ao Root diam: 2.90 cm Ao Asc diam:  2.80 cm MITRAL VALVE               TRICUSPID VALVE MV Area (PHT): 5.34 cm    TR Peak grad:   31.8 mmHg MV Decel Time: 142 msec    TR Vmax:        282.00 cm/s MV E velocity: 98.50 cm/s MV A velocity: 95.40 cm/s  SHUNTS MV E/A ratio:  1.03        Systemic VTI:  0.29 m                            Systemic Diam: 2.00 cm Alwyn Pea MD Electronically signed by Alwyn Pea MD Signature Date/Time: 04/11/2023/2:52:38 PM    Final    DG Chest 2 View Result Date: 04/09/2023 CLINICAL DATA:  Shortness of breath for 1 month. 20 pound weight gain over 6 weeks. Decreased oxygen saturation. EXAM: CHEST - 2 VIEW COMPARISON:  11/05/2019 FINDINGS: Cardiac enlargement. Mild central vascular congestion. Slight interstitial change in the left base may represent early edema. This is progressing since prior study. Small bilateral pleural effusions. No pneumothorax. Esophageal hiatal hernia behind the heart. IMPRESSION: Cardiac enlargement with mild vascular congestion, small pleural effusions, and possible early interstitial edema. Moderate-sized esophageal hiatal hernia behind the heart. Electronically Signed   By: Burman Nieves M.D.   On: 04/09/2023 18:55    ASSESSMENT & PLAN:   Symptomatic anemia # Severe Anemia- [Feb 2025- Hb 4.6] -Hb-symptomatic.  Likely due to iron deficiency. Oral iron: started in FEB 2025 [? Abdominal cramps]  I discussed regarding IV iron infusion/Venofer. Discussed the potential acute infusion reactions with IV iron; which are quite rare.  Patient understands the risk; will proceed with infusions.    # Recommend CBC CMP LDH peripheral smear; haptoglobin today.   #Etiology of iron  deficiency:  I had a long discussion with the patient regarding multiple etiologies of anemia including iron deficiency-which is mainly caused by blood loss/malabsorption.  S/p GI evaluation-  colonoscopy- on [facility] [Dr.Russow] on April 3rd, 2025. Consider capsule study; CT scan abdomen pelvis.   # generalized Headaches x 1 month with blurry vision, dizzy- MRI Brain ASAP.   # CHF- diastolic- Dr.Alluri [[facility]]; awaiting appt.   Thank you Ms.Arvilla Market  for allowing me to participate in the care of your pleasant patient. Please do not hesitate to contact me with questions or concerns in the interim.  # DISPOSITION: # MRI Brain # labs today- please order CBC/ LDH; haptoglobin;  # weekly venofer x3  # follow up  2 months-- MD; labs- cbc/bmp; iron sudies; ferritin- possible venofer- Dr.B    All questions were answered. The patient knows to call the clinic with any problems, questions or concerns.    Earna Coder, MD 04/17/2023 3:18 PM

## 2023-04-17 NOTE — Progress Notes (Signed)
Fatigue/weakness: YES Dyspena: YES Light headedness: YES Blood in stool: NO  Her mom is Bennetta La[place].  SOB x1 month since dx with pneumonia at urgent care, then told it was due to low blood in the [facility] ED.

## 2023-04-17 NOTE — Assessment & Plan Note (Addendum)
#   Severe Anemia- [Feb 2025- Hb 4.6] -Hb-symptomatic.  Likely due to iron deficiency. Oral iron: started in FEB 2025 [? Abdominal cramps]  I discussed regarding IV iron infusion/Venofer. Discussed the potential acute infusion reactions with IV iron; which are quite rare.  Patient understands the risk; will proceed with infusions.    # Recommend CBC CMP LDH peripheral smear; haptoglobin today.   #Etiology of iron deficiency:  I had a long discussion with the patient regarding multiple etiologies of anemia including iron deficiency-which is mainly caused by blood loss/malabsorption.  S/p GI evaluation-  colonoscopy- on [facility] [Dr.Russow] on April 3rd, 2025. Consider capsule study; CT scan abdomen pelvis.   # generalized Headaches x 1 month with blurry vision, dizzy- MRI Brain ASAP.   # CHF- diastolic- Dr.Alluri [[facility]]; awaiting appt.   Thank you Ms.Arvilla Market  for allowing me to participate in the care of your pleasant patient. Please do not hesitate to contact me with questions or concerns in the interim.  # DISPOSITION: # MRI Brain # labs today- please order CBC/ LDH; haptoglobin;  # weekly venofer x3  # follow up  2 months-- MD; labs- cbc/bmp; iron sudies; ferritin- possible venofer- Dr.B

## 2023-04-18 LAB — HAPTOGLOBIN: Haptoglobin: 164 mg/dL (ref 37–355)

## 2023-04-20 ENCOUNTER — Inpatient Hospital Stay: Payer: Medicaid Other

## 2023-04-20 VITALS — BP 135/62 | HR 79 | Temp 98.3°F | Resp 18

## 2023-04-20 DIAGNOSIS — D649 Anemia, unspecified: Secondary | ICD-10-CM

## 2023-04-20 MED ORDER — SODIUM CHLORIDE 0.9% FLUSH
10.0000 mL | Freq: Once | INTRAVENOUS | Status: AC | PRN
  Administered 2023-04-20: 10 mL
  Filled 2023-04-20: qty 10

## 2023-04-20 MED ORDER — IRON SUCROSE 20 MG/ML IV SOLN
200.0000 mg | Freq: Once | INTRAVENOUS | Status: AC
  Administered 2023-04-20: 200 mg via INTRAVENOUS

## 2023-04-21 ENCOUNTER — Ambulatory Visit
Admission: RE | Admit: 2023-04-21 | Discharge: 2023-04-21 | Disposition: A | Discharge status: Home / Self Care | Payer: Medicaid Other | Source: Ambulatory Visit | Attending: Internal Medicine | Admitting: Internal Medicine

## 2023-04-21 DIAGNOSIS — R519 Headache, unspecified: Secondary | ICD-10-CM | POA: Diagnosis present

## 2023-04-21 DIAGNOSIS — D649 Anemia, unspecified: Secondary | ICD-10-CM | POA: Diagnosis present

## 2023-04-21 MED ORDER — GADOBUTROL 1 MMOL/ML IV SOLN
10.0000 mL | Freq: Once | INTRAVENOUS | Status: AC | PRN
  Administered 2023-04-21: 10 mL via INTRAVENOUS

## 2023-04-23 ENCOUNTER — Telehealth: Payer: Self-pay | Admitting: *Deleted

## 2023-04-23 NOTE — Telephone Encounter (Signed)
 RN contacted patient and notified that MRI of the brain does not show any concern for any cancer or acute stroke.  If ongoing headaches-recommend follow-up with PCP for further recommendations.  Follow-up with [place] as planned. Per Dr Donneta Romberg.  Pt verbalized understanding.

## 2023-04-23 NOTE — Progress Notes (Signed)
 Please inform patient that MRI of the brain does not show any concern for any cancer or acute stroke.  If ongoing headaches-recommend follow-up with PCP for further recommendations.  Follow-up with [place] as planned.  Thank you, GB

## 2023-04-24 ENCOUNTER — Other Ambulatory Visit: Payer: Self-pay | Admitting: Family Medicine

## 2023-04-24 DIAGNOSIS — Z1231 Encounter for screening mammogram for malignant neoplasm of breast: Secondary | ICD-10-CM

## 2023-04-27 ENCOUNTER — Inpatient Hospital Stay: Payer: Medicaid Other

## 2023-04-27 VITALS — BP 121/54 | HR 84 | Temp 98.3°F | Resp 16

## 2023-04-27 DIAGNOSIS — D649 Anemia, unspecified: Secondary | ICD-10-CM | POA: Diagnosis not present

## 2023-04-27 MED ORDER — SODIUM CHLORIDE 0.9% FLUSH
10.0000 mL | Freq: Once | INTRAVENOUS | Status: AC | PRN
  Administered 2023-04-27: 10 mL
  Filled 2023-04-27: qty 10

## 2023-04-27 MED ORDER — IRON SUCROSE 20 MG/ML IV SOLN
200.0000 mg | Freq: Once | INTRAVENOUS | Status: AC
  Administered 2023-04-27: 200 mg via INTRAVENOUS

## 2023-05-04 ENCOUNTER — Inpatient Hospital Stay: Payer: Medicaid Other | Attending: Internal Medicine

## 2023-05-04 VITALS — BP 139/67 | HR 81 | Temp 96.3°F | Resp 16

## 2023-05-04 DIAGNOSIS — D649 Anemia, unspecified: Secondary | ICD-10-CM

## 2023-05-04 DIAGNOSIS — D509 Iron deficiency anemia, unspecified: Secondary | ICD-10-CM | POA: Insufficient documentation

## 2023-05-04 MED ORDER — IRON SUCROSE 20 MG/ML IV SOLN
200.0000 mg | Freq: Once | INTRAVENOUS | Status: AC
  Administered 2023-05-04: 200 mg via INTRAVENOUS
  Filled 2023-05-04: qty 10

## 2023-05-25 ENCOUNTER — Other Ambulatory Visit: Payer: Self-pay

## 2023-05-25 ENCOUNTER — Emergency Department

## 2023-05-25 DIAGNOSIS — J209 Acute bronchitis, unspecified: Secondary | ICD-10-CM | POA: Diagnosis not present

## 2023-05-25 DIAGNOSIS — I5031 Acute diastolic (congestive) heart failure: Secondary | ICD-10-CM | POA: Diagnosis not present

## 2023-05-25 DIAGNOSIS — R059 Cough, unspecified: Secondary | ICD-10-CM | POA: Diagnosis present

## 2023-05-25 LAB — CBC
HCT: 33.9 % — ABNORMAL LOW (ref 36.0–46.0)
Hemoglobin: 10.6 g/dL — ABNORMAL LOW (ref 12.0–15.0)
MCH: 27.6 pg (ref 26.0–34.0)
MCHC: 31.3 g/dL (ref 30.0–36.0)
MCV: 88.3 fL (ref 80.0–100.0)
Platelets: 382 10*3/uL (ref 150–400)
RBC: 3.84 MIL/uL — ABNORMAL LOW (ref 3.87–5.11)
RDW: 24.3 % — ABNORMAL HIGH (ref 11.5–15.5)
WBC: 5.2 10*3/uL (ref 4.0–10.5)
nRBC: 0 % (ref 0.0–0.2)

## 2023-05-25 LAB — RESP PANEL BY RT-PCR (RSV, FLU A&B, COVID)  RVPGX2
Influenza A by PCR: NEGATIVE
Influenza B by PCR: NEGATIVE
Resp Syncytial Virus by PCR: NEGATIVE
SARS Coronavirus 2 by RT PCR: NEGATIVE

## 2023-05-25 LAB — BASIC METABOLIC PANEL
Anion gap: 12 (ref 5–15)
BUN: 15 mg/dL (ref 8–23)
CO2: 23 mmol/L (ref 22–32)
Calcium: 9.3 mg/dL (ref 8.9–10.3)
Chloride: 99 mmol/L (ref 98–111)
Creatinine, Ser: 0.72 mg/dL (ref 0.44–1.00)
GFR, Estimated: >60 mL/min (ref >60)
Glucose, Bld: 102 mg/dL — ABNORMAL HIGH (ref 70–99)
Potassium: 3.8 mmol/L (ref 3.5–5.1)
Sodium: 134 mmol/L — ABNORMAL LOW (ref 135–145)

## 2023-05-25 LAB — TROPONIN I (HIGH SENSITIVITY): Troponin I (High Sensitivity): 5 ng/L (ref <18)

## 2023-05-25 MED ORDER — ACETAMINOPHEN 325 MG PO TABS
ORAL_TABLET | ORAL | Status: AC
Start: 2023-05-25 — End: 2023-05-25
  Administered 2023-05-25: 650 mg via ORAL
  Filled 2023-05-25: qty 2

## 2023-05-25 MED ORDER — ACETAMINOPHEN 325 MG PO TABS: 650.0000 mg | ORAL_TABLET | Freq: Once | ORAL | Status: AC

## 2023-05-25 NOTE — ED Triage Notes (Signed)
 Pt arrives with c/o chest pain that started yesterday. Pt endorses SOB, cough, headache, and bodyaches. Pt denies fevers. Pt has hx of HF.

## 2023-05-26 ENCOUNTER — Encounter: Payer: Self-pay | Admitting: Gastroenterology

## 2023-05-26 ENCOUNTER — Emergency Department
Admission: EM | Admit: 2023-05-26 | Discharge: 2023-05-26 | Disposition: A | Discharge status: Home / Self Care | Attending: Emergency Medicine | Admitting: Emergency Medicine

## 2023-05-26 DIAGNOSIS — J209 Acute bronchitis, unspecified: Secondary | ICD-10-CM

## 2023-05-26 LAB — BRAIN NATRIURETIC PEPTIDE: B Natriuretic Peptide: 85.8 pg/mL (ref 0.0–100.0)

## 2023-05-26 LAB — TROPONIN I (HIGH SENSITIVITY): Troponin I (High Sensitivity): 5 ng/L (ref <18)

## 2023-05-26 MED ORDER — ALBUTEROL SULFATE HFA 108 (90 BASE) MCG/ACT IN AERS: 2.0000 | INHALATION_SPRAY | RESPIRATORY_TRACT | 0 refills | Status: AC | PRN

## 2023-05-26 MED ORDER — ALBUTEROL SULFATE (2.5 MG/3ML) 0.083% IN NEBU
2.5000 mg | INHALATION_SOLUTION | Freq: Once | RESPIRATORY_TRACT | Status: AC
  Administered 2023-05-26: 2.5 mg via RESPIRATORY_TRACT
  Filled 2023-05-26: qty 3

## 2023-05-26 MED ORDER — AZITHROMYCIN 250 MG PO TABS: ORAL_TABLET | ORAL | 0 refills | Status: AC

## 2023-05-26 MED ORDER — PREDNISONE 50 MG PO TABS: 50.0000 mg | ORAL_TABLET | Freq: Every day | ORAL | 0 refills | Status: AC

## 2023-05-26 NOTE — ED Notes (Signed)
 Dr Marisa Severin in with pt.

## 2023-05-26 NOTE — Discharge Instructions (Addendum)
 Take the antibiotic and steroid as prescribed and finish the full course.  Use the albuterol every 4-6 hours for the next several days.  Follow-up with your primary care provider.  Return to the ER for new, worsening, or persistent severe shortness of breath, chest discomfort, fever, weakness, or any other new or worsening symptoms that concern you.

## 2023-05-26 NOTE — ED Provider Notes (Signed)
 [facility] Provider Note    Event Date/Time   First MD Initiated Contact with Patient 05/26/23 [phone]     (approximate)   History   Chest Pain   HPI  Sydney Collins is a 63 y.o. female with history of sleep apnea, anemia, GERD, obesity, and recent diagnosis of CHF who presents with shortness of breath for the last several days, associated with some chest discomfort, worse with exertion, associated with cough productive of clear sputum.  The patient also had a fever of 101 today.  She denies any vomiting but did have some diarrhea.  She has no new or worsening leg swelling.  Reviewed the past medical records.  The patient was admitted to the hospitalist service in February with severe anemia, hemoglobin of 4.3, and acute diastolic CHF.   Physical Exam   Triage Vital Signs: ED Triage Vitals  Encounter Vitals Group     BP 05/25/23 2028 (!) 158/97     Systolic BP Percentile --      Diastolic BP Percentile --      Pulse Rate 05/25/23 2028 (!) 120     Resp 05/25/23 2028 (!) 26     Temp 05/25/23 2028 99.7 F (37.6 C)     Temp Source 05/25/23 2028 Oral     SpO2 05/25/23 2028 98 %     Weight 05/25/23 2031 231 lb (104.8 kg)     Height --      Head Circumference --      Peak Flow --      Pain Score 05/25/23 2031 4     Pain Loc --      Pain Education --      Exclude from Growth Chart --     Most recent vital signs: Vitals:   05/26/23 0100 05/26/23 0319  BP: 101/65 114/73  Pulse: 91 84  Resp: 20 19  Temp:  98.7 F (37.1 C)  SpO2: 96%      General: Alert, well-appearing, no distress.  CV:  Good peripheral perfusion.  Resp:  Normal effort.  Slightly coarse breath sounds.  No wheezes or rales. Abd:  No distention.  Other:  No peripheral edema.   ED Results / Procedures / Treatments   Labs (all labs ordered are listed, but only abnormal results are displayed) Labs Reviewed  BASIC METABOLIC PANEL - Abnormal; Notable for the following  components:      Result Value   Sodium 134 (*)    Glucose, Bld 102 (*)    All other components within normal limits  CBC - Abnormal; Notable for the following components:   RBC 3.84 (*)    Hemoglobin 10.6 (*)    HCT 33.9 (*)    RDW 24.3 (*)    All other components within normal limits  RESP PANEL BY RT-PCR (RSV, FLU A&B, COVID)  [id number]  BRAIN NATRIURETIC PEPTIDE  TROPONIN I (HIGH SENSITIVITY)  TROPONIN I (HIGH SENSITIVITY)     EKG  ED ECG REPORT I, Dionne Bucy, the attending physician, personally viewed and interpreted this ECG.  Date: 05/26/2023 EKG Time: 2038 Rate: 116 Rhythm: sinus tachycardia QRS Axis: Right axis Intervals: normal ST/T Wave abnormalities: normal Narrative Interpretation: no evidence of acute ischemia    RADIOLOGY  Chest x-ray: I independently viewed and interpreted the images; there is no focal consolidation or edema   PROCEDURES:  Critical Care performed: No  Procedures   MEDICATIONS ORDERED IN ED: Medications  acetaminophen (TYLENOL) tablet 650  mg (650 mg Oral Given 05/25/23 2334)  albuterol (PROVENTIL) (2.5 MG/3ML) 0.083% nebulizer solution 2.5 mg (2.5 mg Nebulization Given 05/26/23 0129)  albuterol (PROVENTIL) (2.5 MG/3ML) 0.083% nebulizer solution 2.5 mg (2.5 mg Nebulization Given 05/26/23 0129)     IMPRESSION / MDM / ASSESSMENT AND PLAN / ED COURSE  I reviewed the triage vital signs and the nursing notes.  62 year old female with PMH as noted above presents with shortness of breath, cough, fever and diarrhea.  On exam the patient is overall well-appearing.  Vital signs are normal except for borderline elevated temperature; she reports a fever of 101 earlier today.  O2 saturations in the mid 90s on room air.  Breath sounds are coarse but there is no significant wheezing or rales.  There is no peripheral edema.  Differential diagnosis includes, but is not limited to, acute bronchitis, pneumonia, influenza, COVID, other viral  syndrome, less likely CHF exacerbation, ACS.  Chest x-ray shows no edema or other acute findings.  Respiratory panel is negative.  CBC shows no leukocytosis.  Troponins are negative x 2.  Therefore, overall presentation is consistent with acute bronchitis.  We will give bronchodilators and reassess.  Patient's presentation is most consistent with acute presentation with potential threat to life or bodily function.  The patient is on the cardiac monitor to evaluate for evidence of arrhythmia and/or significant heart rate changes.   ----------------------------------------- 2:59 AM on 05/26/2023 -----------------------------------------  The patient is feeling a bit better after the nebulizer treatment.  At this time, she is stable for discharge home.  I have prescribed azithromycin, prednisone, and albuterol.  I counseled the patient on the results of the workup and plan of care.  I gave strict return precautions and she expressed understanding.   FINAL CLINICAL IMPRESSION(S) / ED DIAGNOSES   Final diagnoses:  Acute bronchitis, unspecified organism     Rx / DC Orders   ED Discharge Orders          Ordered    predniSONE (DELTASONE) 50 MG tablet  Daily        05/26/23 0255    azithromycin (ZITHROMAX Z-PAK) 250 MG tablet        05/26/23 0255    albuterol (VENTOLIN HFA) 108 (90 Base) MCG/ACT inhaler  Every 4 hours PRN        05/26/23 0255             Note:  This document was prepared using Dragon voice recognition software and may include unintentional dictation errors.    Dionne Bucy, MD 05/26/23 [phone]

## 2023-06-04 ENCOUNTER — Other Ambulatory Visit: Payer: Self-pay

## 2023-06-04 ENCOUNTER — Ambulatory Visit: Admitting: Certified Registered"

## 2023-06-04 ENCOUNTER — Encounter
Admission: AC | Disposition: A | Discharge status: Home / Self Care | Payer: Self-pay | Source: Home / Self Care | Attending: Gastroenterology

## 2023-06-04 ENCOUNTER — Ambulatory Visit
Admission: AC | Admit: 2023-06-04 | Discharge: 2023-06-04 | Disposition: A | Discharge status: Home / Self Care | Payer: Medicaid Other | Attending: Gastroenterology | Admitting: Gastroenterology

## 2023-06-04 ENCOUNTER — Encounter: Payer: Self-pay | Admitting: Gastroenterology

## 2023-06-04 DIAGNOSIS — K2980 Duodenitis without bleeding: Secondary | ICD-10-CM | POA: Insufficient documentation

## 2023-06-04 DIAGNOSIS — K5730 Diverticulosis of large intestine without perforation or abscess without bleeding: Secondary | ICD-10-CM | POA: Diagnosis not present

## 2023-06-04 DIAGNOSIS — E66813 Obesity, class 3: Secondary | ICD-10-CM | POA: Insufficient documentation

## 2023-06-04 DIAGNOSIS — K219 Gastro-esophageal reflux disease without esophagitis: Secondary | ICD-10-CM | POA: Diagnosis not present

## 2023-06-04 DIAGNOSIS — K259 Gastric ulcer, unspecified as acute or chronic, without hemorrhage or perforation: Secondary | ICD-10-CM | POA: Insufficient documentation

## 2023-06-04 DIAGNOSIS — K269 Duodenal ulcer, unspecified as acute or chronic, without hemorrhage or perforation: Secondary | ICD-10-CM | POA: Diagnosis not present

## 2023-06-04 DIAGNOSIS — Z6841 Body Mass Index (BMI) 40.0 and over, adult: Secondary | ICD-10-CM | POA: Diagnosis not present

## 2023-06-04 DIAGNOSIS — I5032 Chronic diastolic (congestive) heart failure: Secondary | ICD-10-CM | POA: Insufficient documentation

## 2023-06-04 DIAGNOSIS — G4730 Sleep apnea, unspecified: Secondary | ICD-10-CM | POA: Diagnosis not present

## 2023-06-04 DIAGNOSIS — D509 Iron deficiency anemia, unspecified: Secondary | ICD-10-CM | POA: Diagnosis present

## 2023-06-04 DIAGNOSIS — K449 Diaphragmatic hernia without obstruction or gangrene: Secondary | ICD-10-CM | POA: Insufficient documentation

## 2023-06-04 DIAGNOSIS — I110 Hypertensive heart disease with heart failure: Secondary | ICD-10-CM | POA: Insufficient documentation

## 2023-06-04 HISTORY — DX: Unspecified diastolic (congestive) heart failure: I50.30

## 2023-06-04 HISTORY — DX: Primary osteoarthritis, right shoulder: M19.011

## 2023-06-04 HISTORY — DX: Personal history of other infectious and parasitic diseases: Z86.19

## 2023-06-04 HISTORY — PX: COLONOSCOPY WITH PROPOFOL: SHX5780

## 2023-06-04 HISTORY — PX: ESOPHAGOGASTRODUODENOSCOPY (EGD) WITH PROPOFOL: SHX5813

## 2023-06-04 HISTORY — DX: Prediabetes: R73.03

## 2023-06-04 SURGERY — COLONOSCOPY WITH PROPOFOL
Anesthesia: General

## 2023-06-04 MED ORDER — EPINEPHRINE 1 MG/10ML IJ SOSY
PREFILLED_SYRINGE | INTRAMUSCULAR | Status: DC | PRN
  Administered 2023-06-04: 2.5 ug via INTRAVENOUS

## 2023-06-04 MED ORDER — GLYCOPYRROLATE 0.2 MG/ML IJ SOLN
INTRAMUSCULAR | Status: DC | PRN
  Administered 2023-06-04: .2 mg via INTRAVENOUS

## 2023-06-04 MED ORDER — DEXMEDETOMIDINE HCL IN NACL 200 MCG/50ML IV SOLN
INTRAVENOUS | Status: DC | PRN
  Administered 2023-06-04: 12 ug via INTRAVENOUS

## 2023-06-04 MED ORDER — PHENYLEPHRINE 80 MCG/ML (10ML) SYRINGE FOR IV PUSH (FOR BLOOD PRESSURE SUPPORT)
PREFILLED_SYRINGE | INTRAVENOUS | Status: DC | PRN
  Administered 2023-06-04: 80 ug via INTRAVENOUS

## 2023-06-04 MED ORDER — PROPOFOL 10 MG/ML IV BOLUS
INTRAVENOUS | Status: DC | PRN
  Administered 2023-06-04: 30 mg via INTRAVENOUS
  Administered 2023-06-04: 20 mg via INTRAVENOUS
  Administered 2023-06-04: 60 mg via INTRAVENOUS
  Administered 2023-06-04: 20 mg via INTRAVENOUS

## 2023-06-04 MED ORDER — SODIUM CHLORIDE 0.9 % IV SOLN: INTRAVENOUS | Status: DC

## 2023-06-04 MED ORDER — ALBUTEROL SULFATE HFA 108 (90 BASE) MCG/ACT IN AERS
INHALATION_SPRAY | RESPIRATORY_TRACT | Status: DC | PRN
  Administered 2023-06-04 (×2): 2 via RESPIRATORY_TRACT

## 2023-06-04 MED ORDER — PROPOFOL 500 MG/50ML IV EMUL
INTRAVENOUS | Status: DC | PRN
  Administered 2023-06-04: 145 ug/kg/min via INTRAVENOUS

## 2023-06-04 MED ORDER — LIDOCAINE HCL (CARDIAC) PF 100 MG/5ML IV SOSY
PREFILLED_SYRINGE | INTRAVENOUS | Status: DC | PRN
  Administered 2023-06-04: 100 mg via INTRAVENOUS

## 2023-06-04 NOTE — Anesthesia Postprocedure Evaluation (Signed)
 Anesthesia Post Note  Patient: Sydney Collins  Procedure(s) Performed: COLONOSCOPY WITH PROPOFOL ESOPHAGOGASTRODUODENOSCOPY (EGD) WITH PROPOFOL  Patient location during evaluation: Endoscopy Anesthesia Type: General Level of consciousness: awake and alert Pain management: pain level controlled Vital Signs Assessment: post-procedure vital signs reviewed and stable Respiratory status: spontaneous breathing, nonlabored ventilation, respiratory function stable and patient connected to nasal cannula oxygen Cardiovascular status: blood pressure returned to baseline and stable Postop Assessment: no apparent nausea or vomiting Anesthetic complications: no   No notable events documented.   Last Vitals:  Vitals:   06/04/23 0959 06/04/23 1007  BP: (!) 92/53 (!) 93/58  Pulse: 71 67  Resp: 17 16  Temp:    SpO2: 100% 98%    Last Pain:  Vitals:   06/04/23 1007  TempSrc:   PainSc: 0-No pain                 Louie Boston

## 2023-06-04 NOTE — Op Note (Signed)
 [facility] Gastroenterology Patient Name: Sydney Collins Procedure Date: 06/04/2023 8:38 AM MRN: [id number] Account #: [id number] Date of Birth: 16-Sep-1960 Admit Type: Outpatient Age: 63 Room: [facility] ENDO ROOM 1 Gender: Female Note Status: Finalized Instrument Name: Colonscope [id number] Procedure:             Colonoscopy Indications:           Iron deficiency anemia Providers:             [name] DO, DO Referring MD:          No Local Md, MD (Referring MD) Medicines:             Monitored Anesthesia Care Complications:         No immediate complications. Estimated blood loss: None. Procedure:             Pre-Anesthesia Assessment:                        - Prior to the procedure, a History and Physical was                         performed, and patient medications and allergies were                         reviewed. The patient is competent. The risks and                         benefits of the procedure and the sedation options and                         risks were discussed with the patient. All questions                         were answered and informed consent was obtained.                         Patient identification and proposed procedure were                         verified by the physician, the nurse, the anesthetist                         and the technician in the endoscopy suite. Mental                         Status Examination: alert and oriented. Airway                         Examination: normal oropharyngeal airway and neck                         mobility. Respiratory Examination: clear to                         auscultation. CV Examination: RRR, no murmurs, no S3                         or S4. Prophylactic Antibiotics: The patient does not  require prophylactic antibiotics. Prior                         Anticoagulants: The patient has taken no anticoagulant                         or antiplatelet agents. ASA  Grade Assessment: III - A                         patient with severe systemic disease. After reviewing                         the risks and benefits, the patient was deemed in                         satisfactory condition to undergo the procedure. The                         anesthesia plan was to use monitored anesthesia care                         (MAC). Immediately prior to administration of                         medications, the patient was re-assessed for adequacy                         to receive sedatives. The heart rate, respiratory                         rate, oxygen saturations, blood pressure, adequacy of                         pulmonary ventilation, and response to care were                         monitored throughout the procedure. The physical                         status of the patient was re-assessed after the                         procedure.                        After obtaining informed consent, the colonoscope was                         passed under direct vision. Throughout the procedure,                         the patient's blood pressure, pulse, and oxygen                         saturations were monitored continuously. The                         Colonoscope was introduced through the anus and  advanced to the the terminal ileum, with                         identification of the appendiceal orifice and IC                         valve. The colonoscopy was performed without                         difficulty. The patient tolerated the procedure well.                         The quality of the bowel preparation was evaluated                         using the BBPS [facility] Bowel Preparation Scale) with                         scores of: Right Colon = 2 (minor amount of residual                         staining, small fragments of stool and/or opaque                         liquid, but mucosa seen well), Transverse Colon = 3                          (entire mucosa seen well with no residual staining,                         small fragments of stool or opaque liquid) and Left                         Colon = 3 (entire mucosa seen well with no residual                         staining, small fragments of stool or opaque liquid).                         The total BBPS score equals 8. The quality of the                         bowel preparation was excellent. The terminal ileum,                         ileocecal valve, appendiceal orifice, and rectum were                         photographed. Findings:      The perianal and digital rectal examinations were normal. Pertinent       negatives include normal sphincter tone.      The terminal ileum appeared normal. Estimated blood loss: none.      Retroflexion in the right colon was performed.      Multiple small-mouthed diverticula were found in the sigmoid colon.       Estimated blood loss: none.      The exam was otherwise without  abnormality on direct and retroflexion       views. Impression:            - The examined portion of the ileum was normal.                        - Diverticulosis in the sigmoid colon.                        - The examination was otherwise normal on direct and                         retroflexion views.                        - No specimens collected. Recommendation:        - Patient has a contact number available for                         emergencies. The signs and symptoms of potential                         delayed complications were discussed with the patient.                         Return to normal activities tomorrow. Written                         discharge instructions were provided to the patient.                        - Discharge patient to home.                        - Resume previous diet.                        - Continue present medications.                        - Await pathology results.                        - Repeat  colonoscopy in 10 years for screening                         purposes.                        - Return to GI office as previously scheduled.                        - The findings and recommendations were discussed with                         the patient. Procedure Code(s):     --- Professional ---                        [phone], Colonoscopy, flexible; diagnostic, including  collection of specimen(s) by brushing or washing, when                         performed (separate procedure) Diagnosis Code(s):     --- Professional ---                        D50.9, Iron deficiency anemia, unspecified                        K57.30, Diverticulosis of large intestine without                         perforation or abscess without bleeding CPT copyright 2022 American Medical Association. All rights reserved. The codes documented in this report are preliminary and upon coder review may  be revised to meet current compliance requirements. Attending Participation:      I personally performed the entire procedure. Elfredia Nevins, DO [name] DO, DO 06/04/2023 9:26:47 AM This report has been signed electronically. Number of Addenda: 0 Note Initiated On: 06/04/2023 8:38 AM Scope Withdrawal Time: 0 hours 9 minutes 44 seconds  Total Procedure Duration: 0 hours 12 minutes 6 seconds  Estimated Blood Loss:  Estimated blood loss: none.      [facility]

## 2023-06-04 NOTE — Transfer of Care (Signed)
 Immediate Anesthesia Transfer of Care Note  Patient: Sydney Collins  Procedure(s) Performed: COLONOSCOPY WITH PROPOFOL ESOPHAGOGASTRODUODENOSCOPY (EGD) WITH PROPOFOL  Patient Location: Endoscopy Unit  Anesthesia Type:General  Level of Consciousness: drowsy and patient cooperative  Airway & Oxygen Therapy: Patient Spontanous Breathing and Patient connected to face mask oxygen  Post-op Assessment: Report given to RN and Post -op Vital signs reviewed and stable  Post vital signs: Reviewed and stable  Last Vitals:  Vitals Value Taken Time  BP 91/72 06/04/23 0926  Temp    Pulse    Resp 16 06/04/23 0926  SpO2 100 % 06/04/23 0926    Last Pain:  Vitals:   06/04/23 0926  TempSrc:   PainSc: Asleep         Complications: No notable events documented.

## 2023-06-04 NOTE — H&P (Signed)
 Pre-Procedure H&P   Patient ID: Sydney Collins is a 63 y.o. female.  Gastroenterology Provider: [name], DO  Referring Provider: Fransico Setters, NP PCP: [facility]  Date: 06/04/2023  HPI [name] is a 63 y.o. female who presents today for Esophagogastroduodenoscopy and Colonoscopy for Iron deficiency anemia .  Patient was seen in the hospital for severe symptomatic anemia.  Found to be iron deficient.  Also diagnosed with diastolic heart failure.  Experience shortness of breath, weight gain and extremity swelling at that time.  Bowel movements been regular without melena or hematochezia.  No hematemesis or coffee-ground emesis.  Initially presented with a hemoglobin of 4.3 MCV 71.4 platelets 560,000.  Ferritin was 2 iron saturation 4 B12 218 folate 12.6.  FOBT was negative.  She received 4 units of PRBCs during hospitalization improved to 10.3.  Most recent hemoglobin 10.6 MCV 88 platelets 282,000   Past Medical History:  Diagnosis Date   Anemia    H/O   Arthritis    Complication of anesthesia    could feel everything when she was being extubated   Diastolic heart failure (HCC)    GERD (gastroesophageal reflux disease)    H/O   History of chickenpox    Hypertension    H/O OVER A SHORT PERIOD OF TIME- LOST WEIGHT AND IS NO LONGER TAKING BP MED PER PCP   Pre-diabetes    Primary osteoarthritis of right shoulder    Sleep apnea 1999   WAS TOLD SHE STOPPED BREATHING 80 TIMES DURING THE NIGHT.  NEVER WAS GIVEN CPAP MACHINE PER PT    Past Surgical History:  Procedure Laterality Date   APPENDECTOMY     SHOULDER ARTHROSCOPY Right 06/05/2015   Procedure: ARTHROSCOPY SHOULDER;  Surgeon: Christena Flake, MD;  Location: [facility] ORS;  Service: Orthopedics;  Laterality: Right;   SHOULDER ARTHROSCOPY WITH DEBRIDEMENT AND BICEP TENDON REPAIR Right 05/13/2016   Procedure: SHOULDER ARTHROSCOPY WITH DEBRIDEMENT AND BICEP TENDON REPAIR;  Surgeon: Christena Flake, MD;  Location: [facility] ORS;  Service: Orthopedics;  Laterality: Right;   SHOULDER ARTHROSCOPY WITH OPEN ROTATOR CUFF REPAIR Right 05/13/2016   Procedure: SHOULDER ARTHROSCOPY WITH OPEN ROTATOR CUFF REPAIR;  Surgeon: Christena Flake, MD;  Location: [facility] ORS;  Service: Orthopedics;  Laterality: Right;   SHOULDER ARTHROSCOPY WITH OPEN ROTATOR CUFF REPAIR Right 03/17/2017   Procedure: SHOULDER ARTHROSCOPY WITH OPEN ROTATOR CUFF REPAIR;  Surgeon: Christena Flake, MD;  Location: [facility] ORS;  Service: Orthopedics;  Laterality: Right;  arthroscopic debridement    SINUS IRRIGATION      Family History No h/o GI disease or malignancy  Review of Systems  Constitutional:  Negative for activity change, appetite change, chills, diaphoresis, fatigue, fever and unexpected weight change.  HENT:  Negative for trouble swallowing and voice change.   Respiratory:  Negative for shortness of breath and wheezing.   Cardiovascular:  Negative for chest pain, palpitations and leg swelling.  Gastrointestinal:  Negative for abdominal distention, abdominal pain, anal bleeding, blood in stool, constipation, diarrhea, nausea, rectal pain and vomiting.  Musculoskeletal:  Negative for arthralgias and myalgias.  Skin:  Negative for color change and pallor.  Neurological:  Negative for dizziness, syncope and weakness.  Psychiatric/Behavioral:  Negative for confusion.   All other systems reviewed and are negative.    Medications No current facility-administered medications on file prior to encounter.   Current Outpatient Medications on File Prior to Encounter  Medication Sig Dispense Refill  acetaminophen (TYLENOL) 500 MG tablet Take 1 tablet (500 mg total) by mouth every 6 (six) hours as needed for moderate pain (pain score 4-6) or headache. 30 tablet 0   ferrous sulfate 325 (65 FE) MG tablet Take 1 tablet (325 mg total) by mouth 2 (two) times daily with a meal. 60 tablet 3   furosemide (LASIX) 20 MG tablet Take 1 tablet (20  mg total) by mouth 2 (two) times daily. 60 tablet 2    Pertinent medications related to GI and procedure were reviewed by me with the patient prior to the procedure   Current Facility-Administered Medications:    0.9 %  sodium chloride infusion, , Intravenous, Continuous, [name], DO  sodium chloride         Allergies  Allergen Reactions   Latex Other (See Comments)    blisters   Allergies were reviewed by me prior to the procedure  Objective   Body mass index is 41.1 kg/m. Vitals:   06/04/23 0835  BP: 123/78  Pulse: 79  Resp: 18  Temp: (!) 97 F (36.1 C)  TempSrc: Temporal  SpO2: 96%  Weight: 105.2 kg  Height: 5\' 3"  (1.6 m)     Physical Exam Vitals and nursing note reviewed.  Constitutional:      General: She is not in acute distress.    Appearance: Normal appearance. She is obese. She is not ill-appearing, toxic-appearing or diaphoretic.  HENT:     Head: Normocephalic and atraumatic.     Nose: Nose normal.     Mouth/Throat:     Mouth: Mucous membranes are moist.     Pharynx: Oropharynx is clear.  Eyes:     General: No scleral icterus.    Extraocular Movements: Extraocular movements intact.  Cardiovascular:     Rate and Rhythm: Normal rate and regular rhythm.     Heart sounds: Normal heart sounds. No murmur heard.    No friction rub. No gallop.  Pulmonary:     Effort: Pulmonary effort is normal. No respiratory distress.     Breath sounds: Normal breath sounds. No wheezing, rhonchi or rales.  Abdominal:     General: Bowel sounds are normal. There is no distension.     Palpations: Abdomen is soft.     Tenderness: There is no abdominal tenderness. There is no guarding or rebound.  Musculoskeletal:     Cervical back: Neck supple.     Right lower leg: No edema.     Left lower leg: No edema.  Skin:    General: Skin is warm and dry.     Coloration: Skin is not jaundiced or pale.  Neurological:     General: No focal deficit present.      Mental Status: She is alert and oriented to person, place, and time. Mental status is at baseline.  Psychiatric:        Mood and Affect: Mood normal.        Behavior: Behavior normal.        Thought Content: Thought content normal.        Judgment: Judgment normal.      Assessment:  [name] is a 63 y.o. female  who presents today for Esophagogastroduodenoscopy and Colonoscopy for Iron deficiency anemia .  Plan:  Esophagogastroduodenoscopy and Colonoscopy with possible intervention today  Esophagogastroduodenoscopy and Colonoscopy with possible biopsy, control of bleeding, polypectomy, and interventions as necessary has been discussed with the patient/patient representative. Informed consent was obtained from the  patient/patient representative after explaining the indication, nature, and risks of the procedure including but not limited to death, bleeding, perforation, missed neoplasm/lesions, cardiorespiratory compromise, and reaction to medications. Opportunity for questions was given and appropriate answers were provided. Patient/patient representative has verbalized understanding is amenable to undergoing the procedure.   [name], DO  [facility] Gastroenterology  Portions of the record may have been created with voice recognition software. Occasional wrong-word or 'sound-a-like' substitutions may have occurred due to the inherent limitations of voice recognition software.  Read the chart carefully and recognize, using context, where substitutions may have occurred.

## 2023-06-04 NOTE — Anesthesia Preprocedure Evaluation (Addendum)
 Anesthesia Evaluation  Patient identified by MRN, date of birth, ID band Patient awake    Reviewed: Allergy & Precautions, NPO status , Patient's Chart, lab work & pertinent test results  History of Anesthesia Complications Negative for: history of anesthetic complications  Airway Mallampati: III  TM Distance: >3 FB Neck ROM: full    Dental  (+) Partial Upper   Pulmonary sleep apnea    Pulmonary exam normal        Cardiovascular hypertension, On Medications +CHF  Normal cardiovascular exam  Echo 2/25 IMPRESSIONS     1. Left ventricular ejection fraction, by estimation, is 55 to 60%. The  left ventricle has normal function. The left ventricle has no regional  wall motion abnormalities. Left ventricular diastolic parameters were  normal.   2. Right ventricular systolic function is normal. The right ventricular  size is normal.   3. The mitral valve is normal in structure. Trivial mitral valve  regurgitation.   4. The aortic valve is normal in structure. Aortic valve regurgitation is  not visualized.     Neuro/Psych negative neurological ROS  negative psych ROS   GI/Hepatic Neg liver ROS,GERD  ,,  Endo/Other    Class 3 obesity  Renal/GU negative Renal ROS  negative genitourinary   Musculoskeletal   Abdominal   Peds  Hematology  (+) Blood dyscrasia, anemia   Anesthesia Other Findings Past Medical History: No date: Anemia     Comment:  H/O No date: Arthritis No date: Complication of anesthesia     Comment:  could feel everything when she was being extubated No date: Diastolic heart failure (HCC) No date: GERD (gastroesophageal reflux disease)     Comment:  H/O No date: History of chickenpox No date: Hypertension     Comment:  H/O OVER A SHORT PERIOD OF TIME- LOST WEIGHT AND IS NO               LONGER TAKING BP MED PER PCP No date: Pre-diabetes No date: Primary osteoarthritis of right shoulder 1999:  Sleep apnea     Comment:  WAS TOLD SHE STOPPED BREATHING 80 TIMES DURING THE               NIGHT.  NEVER WAS GIVEN CPAP MACHINE PER PT  Past Surgical History: No date: APPENDECTOMY 06/05/2015: SHOULDER ARTHROSCOPY; Right     Comment:  Procedure: ARTHROSCOPY SHOULDER;  Surgeon: Christena Flake,              MD;  Location: [facility] ORS;  Service: Orthopedics;                Laterality: Right; 05/13/2016: SHOULDER ARTHROSCOPY WITH DEBRIDEMENT AND BICEP TENDON  REPAIR; Right     Comment:  Procedure: SHOULDER ARTHROSCOPY WITH DEBRIDEMENT AND               BICEP TENDON REPAIR;  Surgeon: Christena Flake, MD;                Location: [facility] ORS;  Service: Orthopedics;  Laterality:               Right; 05/13/2016: SHOULDER ARTHROSCOPY WITH OPEN ROTATOR CUFF REPAIR; Right     Comment:  Procedure: SHOULDER ARTHROSCOPY WITH OPEN ROTATOR CUFF               REPAIR;  Surgeon: Christena Flake, MD;  Location: [facility] ORS;               Service: Orthopedics;  Laterality: Right; 03/17/2017: SHOULDER ARTHROSCOPY WITH OPEN ROTATOR CUFF REPAIR; Right     Comment:  Procedure: SHOULDER ARTHROSCOPY WITH OPEN ROTATOR CUFF               REPAIR;  Surgeon: Christena Flake, MD;  Location: [facility] ORS;              Service: Orthopedics;  Laterality: Right;  arthroscopic               debridement  No date: SINUS IRRIGATION     Reproductive/Obstetrics negative OB ROS                             Anesthesia Physical Anesthesia Plan  ASA: 3  Anesthesia Plan: General   Post-op Pain Management: Minimal or no pain anticipated   Induction: Intravenous  PONV Risk Score and Plan: 2 and Propofol infusion and TIVA  Airway Management Planned: Natural Airway and Nasal Cannula  Additional Equipment:   Intra-op Plan:   Post-operative Plan:   Informed Consent: I have reviewed the patients History and Physical, chart, labs and discussed the procedure including the risks, benefits and alternatives for the proposed  anesthesia with the patient or authorized representative who has indicated his/her understanding and acceptance.     Dental Advisory Given  Plan Discussed with: Anesthesiologist, CRNA and Surgeon  Anesthesia Plan Comments: (Patient consented for risks of anesthesia including but not limited to:  - adverse reactions to medications - risk of airway placement if required - damage to eyes, teeth, lips or other oral mucosa - nerve damage due to positioning  - sore throat or hoarseness - Damage to heart, brain, nerves, lungs, other parts of body or loss of life  Patient voiced understanding and assent.)       Anesthesia Quick Evaluation

## 2023-06-04 NOTE — Interval H&P Note (Signed)
 History and Physical Interval Note: Preprocedure H&P from 06/04/23  was reviewed and there was no interval change after seeing and examining the patient.  Written consent was obtained from the patient after discussion of risks, benefits, and alternatives. Patient has consented to proceed with Esophagogastroduodenoscopy and Colonoscopy with possible intervention   06/04/2023 8:49 AM  Sydney Collins  has presented today for surgery, with the diagnosis of D50.9 (ICD-10-CM) - Iron deficiency anemia, unspecified iron deficiency anemia type.  The various methods of treatment have been discussed with the patient and family. After consideration of risks, benefits and other options for treatment, the patient has consented to  Procedure(s): COLONOSCOPY WITH PROPOFOL (N/A) ESOPHAGOGASTRODUODENOSCOPY (EGD) WITH PROPOFOL (N/A) as a surgical intervention.  The patient's history has been reviewed, patient examined, no change in status, stable for surgery.  I have reviewed the patient's chart and labs.  Questions were answered to the patient's satisfaction.     [name]

## 2023-06-04 NOTE — Op Note (Signed)
 [facility] Gastroenterology Patient Name: Sydney Collins Procedure Date: 06/04/2023 8:41 AM MRN: [id number] Account #: [id number] Date of Birth: [birth year] Admit Type: Outpatient Age: 63 Room: [facility] ENDO ROOM 1 Gender: Female Note Status: Finalized Instrument Name: Upper Endoscope [id number] Procedure:             Upper GI endoscopy Indications:           Iron deficiency anemia Providers:             [name] DO, DO Referring MD:          No Local Md, MD (Referring MD) Medicines:             Monitored Anesthesia Care Complications:         No immediate complications. Estimated blood loss:                         Minimal. Procedure:             Pre-Anesthesia Assessment:                        - Prior to the procedure, a History and Physical was                         performed, and patient medications and allergies were                         reviewed. The patient is competent. The risks and                         benefits of the procedure and the sedation options and                         risks were discussed with the patient. All questions                         were answered and informed consent was obtained.                         Patient identification and proposed procedure were                         verified by the physician, the nurse, the anesthetist                         and the technician in the endoscopy suite. Mental                         Status Examination: alert and oriented. Airway                         Examination: normal oropharyngeal airway and neck                         mobility. Respiratory Examination: clear to                         auscultation. CV Examination: RRR, no murmurs, no S3  or S4. Prophylactic Antibiotics: The patient does not                         require prophylactic antibiotics. Prior                         Anticoagulants: The patient has taken no anticoagulant                          or antiplatelet agents. ASA Grade Assessment: III - A                         patient with severe systemic disease. After reviewing                         the risks and benefits, the patient was deemed in                         satisfactory condition to undergo the procedure. The                         anesthesia plan was to use monitored anesthesia care                         (MAC). Immediately prior to administration of                         medications, the patient was re-assessed for adequacy                         to receive sedatives. The heart rate, respiratory                         rate, oxygen saturations, blood pressure, adequacy of                         pulmonary ventilation, and response to care were                         monitored throughout the procedure. The physical                         status of the patient was re-assessed after the                         procedure.                        After obtaining informed consent, the endoscope was                         passed under direct vision. Throughout the procedure,                         the patient's blood pressure, pulse, and oxygen                         saturations were monitored continuously. The Endoscope  was introduced through the mouth, and advanced to the                         third part of duodenum. The upper GI endoscopy was                         accomplished without difficulty. The patient tolerated                         the procedure well. Findings:      Multiple localized erosions without bleeding were found in the first       portion of the duodenum. Biopsies were taken with a cold forceps for       histology. Estimated blood loss was minimal.      The exam of the duodenum was otherwise normal.      Four non-bleeding cratered gastric ulcers with a clean ulcer base       (Forrest Class III) were found in the gastric antrum. The largest lesion        was 4 mm in largest dimension. Biopsies were taken with a cold forceps       for histology. Estimated blood loss was minimal.      A 5 cm hiatal hernia was present. Estimated blood loss: none.      The exam of the stomach was otherwise normal.      The Z-line was regular. Estimated blood loss: none.      Esophagogastric landmarks were identified: the gastroesophageal junction       was found at 30 cm from the incisors.      The exam of the esophagus was otherwise normal. Impression:            - Duodenal erosions without bleeding. Biopsied.                        - Non-bleeding gastric ulcers with a clean ulcer base                         (Forrest Class III). Biopsied.                        - 5 cm hiatal hernia.                        - Z-line regular.                        - Esophagogastric landmarks identified. Recommendation:        - Patient has a contact number available for                         emergencies. The signs and symptoms of potential                         delayed complications were discussed with the patient.                         Return to normal activities tomorrow. Written                         discharge instructions were provided  to the patient.                        - Discharge patient to home.                        - Resume previous diet.                        - Continue present medications.                        - Continue twice a day proton pump inhibitor.                        Discontinue all non-steroidal anti inflammatories                         including aspirin                        - Await pathology results.                        - Return to GI clinic as previously scheduled.                        - proceed with colonoscopy. see report for further                         recommendations.                        - The findings and recommendations were discussed with                         the patient. Procedure Code(s):     ---  Professional ---                        [phone], Esophagogastroduodenoscopy, flexible,                         transoral; with biopsy, single or multiple Diagnosis Code(s):     --- Professional ---                        K26.9, Duodenal ulcer, unspecified as acute or                         chronic, without hemorrhage or perforation                        K25.9, Gastric ulcer, unspecified as acute or chronic,                         without hemorrhage or perforation                        K44.9, Diaphragmatic hernia without obstruction or                         gangrene  D50.9, Iron deficiency anemia, unspecified CPT copyright 2022 American Medical Association. All rights reserved. The codes documented in this report are preliminary and upon coder review may  be revised to meet current compliance requirements. Attending Participation:      I personally performed the entire procedure. Elfredia Nevins, DO [name] DO, DO 06/04/2023 9:06:46 AM This report has been signed electronically. Number of Addenda: 0 Note Initiated On: 06/04/2023 8:41 AM Estimated Blood Loss:  Estimated blood loss was minimal.      [facility]

## 2023-06-05 ENCOUNTER — Encounter: Payer: Self-pay | Admitting: Gastroenterology

## 2023-06-05 LAB — SURGICAL PATHOLOGY

## 2023-06-15 ENCOUNTER — Inpatient Hospital Stay: Payer: Medicaid Other

## 2023-06-15 ENCOUNTER — Encounter: Payer: Self-pay | Admitting: Internal Medicine

## 2023-06-15 ENCOUNTER — Ambulatory Visit: Payer: Medicaid Other | Admitting: Gastroenterology

## 2023-06-15 ENCOUNTER — Inpatient Hospital Stay (HOSPITAL_BASED_OUTPATIENT_CLINIC_OR_DEPARTMENT_OTHER): Payer: Medicaid Other | Admitting: Internal Medicine

## 2023-06-15 ENCOUNTER — Telehealth: Payer: Self-pay

## 2023-06-15 ENCOUNTER — Inpatient Hospital Stay: Payer: Medicaid Other | Attending: Internal Medicine

## 2023-06-15 DIAGNOSIS — E538 Deficiency of other specified B group vitamins: Secondary | ICD-10-CM | POA: Diagnosis not present

## 2023-06-15 DIAGNOSIS — D649 Anemia, unspecified: Secondary | ICD-10-CM | POA: Diagnosis not present

## 2023-06-15 DIAGNOSIS — D509 Iron deficiency anemia, unspecified: Secondary | ICD-10-CM | POA: Insufficient documentation

## 2023-06-15 DIAGNOSIS — R519 Headache, unspecified: Secondary | ICD-10-CM

## 2023-06-15 DIAGNOSIS — I110 Hypertensive heart disease with heart failure: Secondary | ICD-10-CM | POA: Insufficient documentation

## 2023-06-15 DIAGNOSIS — K259 Gastric ulcer, unspecified as acute or chronic, without hemorrhage or perforation: Secondary | ICD-10-CM | POA: Diagnosis not present

## 2023-06-15 LAB — BASIC METABOLIC PANEL WITH GFR
Anion gap: 10 (ref 5–15)
BUN: 17 mg/dL (ref 8–23)
CO2: 28 mmol/L (ref 22–32)
Calcium: 9.1 mg/dL (ref 8.9–10.3)
Chloride: 101 mmol/L (ref 98–111)
Creatinine, Ser: 0.77 mg/dL (ref 0.44–1.00)
GFR, Estimated: >60 mL/min (ref >60)
Glucose, Bld: 97 mg/dL (ref 70–99)
Potassium: 3.4 mmol/L — ABNORMAL LOW (ref 3.5–5.1)
Sodium: 139 mmol/L (ref 135–145)

## 2023-06-15 LAB — CBC WITH DIFFERENTIAL (CANCER CENTER ONLY)
Abs Immature Granulocytes: 0.03 10*3/uL (ref 0.00–0.07)
Basophils Absolute: 0.1 10*3/uL (ref 0.0–0.1)
Basophils Relative: 1 %
Eosinophils Absolute: 0.2 10*3/uL (ref 0.0–0.5)
Eosinophils Relative: 3 %
HCT: 34.7 % — ABNORMAL LOW (ref 36.0–46.0)
Hemoglobin: 10.8 g/dL — ABNORMAL LOW (ref 12.0–15.0)
Immature Granulocytes: 0 %
Lymphocytes Relative: 33 %
Lymphs Abs: 2.7 10*3/uL (ref 0.7–4.0)
MCH: 27.8 pg (ref 26.0–34.0)
MCHC: 31.1 g/dL (ref 30.0–36.0)
MCV: 89.4 fL (ref 80.0–100.0)
Monocytes Absolute: 0.6 10*3/uL (ref 0.1–1.0)
Monocytes Relative: 7 %
Neutro Abs: 4.8 10*3/uL (ref 1.7–7.7)
Neutrophils Relative %: 56 %
Platelet Count: 503 10*3/uL — ABNORMAL HIGH (ref 150–400)
RBC: 3.88 MIL/uL (ref 3.87–5.11)
RDW: 18.7 % — ABNORMAL HIGH (ref 11.5–15.5)
WBC Count: 8.4 10*3/uL (ref 4.0–10.5)
nRBC: 0 % (ref 0.0–0.2)

## 2023-06-15 LAB — IRON AND TIBC
Iron: 28 ug/dL (ref 28–170)
Saturation Ratios: 6 % — ABNORMAL LOW (ref 10.4–31.8)
TIBC: 445 ug/dL (ref 250–450)
UIBC: 417 ug/dL

## 2023-06-15 LAB — FERRITIN: Ferritin: 7 ng/mL — ABNORMAL LOW (ref 11–307)

## 2023-06-15 MED ORDER — IRON SUCROSE 20 MG/ML IV SOLN
200.0000 mg | Freq: Once | INTRAVENOUS | Status: AC
  Administered 2023-06-15: 200 mg via INTRAVENOUS
  Filled 2023-06-15: qty 10

## 2023-06-15 NOTE — Telephone Encounter (Signed)
 Patient was seen at [facility] on 04/14/2023 with Mrs. Sydney Collins. Then she had her colonoscopy done by Dr. Mamie Searles on 06/04/2023. Therefore, we will have to cancel patient's appointment with Dr. Antony Baumgartner for today since we are not able to see their patients. A voicemail was left on patient's phone.

## 2023-06-15 NOTE — Progress Notes (Signed)
 Patient had colonoscopy on 06/04/2023, and she had a x ray on 05/25/2023, that is when she was diagnosed with bronchitis. She is still having some shortness of breath.

## 2023-06-15 NOTE — Progress Notes (Signed)
  Cancer Center CONSULT NOTE  Patient Care Team: Earna Coder, MD as Consulting Physician (Oncology)  CHIEF COMPLAINTS/PURPOSE OF CONSULTATION: ANEMIA   HEMATOLOGY HISTORY  # ANEMIA[Hb; MCV-platelets- WBC; Iron sat; ferritin;  GFR- CT/US- ;    Latest Reference Range & Units Most Recent  Iron 28 - 170 ug/dL 21 (L) 03/08/08 96:04  UIBC ug/dL 540 11/08/09 91:47  TIBC 250 - 450 ug/dL 829 (H) 07/07/19 30:86  Saturation Ratios 10.4 - 31.8 % 4 (L) 04/10/23 00:47  Ferritin 11 - 307 ng/mL 2 (L) 04/10/23 00:47  Folate >5.9 ng/mL 12.6 04/10/23 00:47  (L): Data is abnormally low (H): Data is abnormally high  HISTORY OF PRESENTING ILLNESS: /.Patient ambulating-independently. Alone.  Sydney Collins 63 y.o.  female pleasant patient with a hx of= sleep apnea, anemia, GERD, obesity, chronic right upper sided abdominal discomfort- diastolic CHF and iron deficiency anemia unclear etiology is here for follow-up.  Patient underwent IV iron infusions. patient had colonoscopy on 06/04/2023.  Patient continues to have mild shortness of breath on exertion.  Otherwise overall improved.   Review of Systems  Constitutional:  Positive for malaise/fatigue. Negative for chills, diaphoresis, fever and weight loss.  HENT:  Negative for nosebleeds and sore throat.   Eyes:  Negative for double vision.  Respiratory:  Positive for shortness of breath. Negative for cough, hemoptysis, sputum production and wheezing.   Cardiovascular:  Negative for chest pain, palpitations, orthopnea and leg swelling.  Gastrointestinal:  Negative for abdominal pain, blood in stool, constipation, diarrhea, heartburn, melena, nausea and vomiting.  Genitourinary:  Negative for dysuria, frequency and urgency.  Musculoskeletal:  Negative for back pain and joint pain.  Skin: Negative.  Negative for itching and rash.  Neurological:  Negative for dizziness, tingling, focal weakness, weakness and headaches.   Endo/Heme/Allergies:  Does not bruise/bleed easily.  Psychiatric/Behavioral:  Negative for depression. The patient is not nervous/anxious and does not have insomnia.      MEDICAL HISTORY:  Past Medical History:  Diagnosis Date   Anemia    H/O   Arthritis    Complication of anesthesia    could feel everything when she was being extubated   Diastolic heart failure (HCC)    GERD (gastroesophageal reflux disease)    H/O   History of chickenpox    Hypertension    H/O OVER A SHORT PERIOD OF TIME- LOST WEIGHT AND IS NO LONGER TAKING BP MED PER PCP   Pre-diabetes    Primary osteoarthritis of right shoulder    Sleep apnea 1999   WAS TOLD SHE STOPPED BREATHING 80 TIMES DURING THE NIGHT.  NEVER WAS GIVEN CPAP MACHINE PER PT    SURGICAL HISTORY: Past Surgical History:  Procedure Laterality Date   APPENDECTOMY     COLONOSCOPY WITH PROPOFOL N/A 06/04/2023   Procedure: COLONOSCOPY WITH PROPOFOL;  Surgeon: [name], DO;  Location: [facility] ENDOSCOPY;  Service: Gastroenterology;  Laterality: N/A;   ESOPHAGOGASTRODUODENOSCOPY (EGD) WITH PROPOFOL N/A 06/04/2023   Procedure: ESOPHAGOGASTRODUODENOSCOPY (EGD) WITH PROPOFOL;  Surgeon: [name], DO;  Location: [facility] ENDOSCOPY;  Service: Gastroenterology;  Laterality: N/A;   SHOULDER ARTHROSCOPY Right 06/05/2015   Procedure: ARTHROSCOPY SHOULDER;  Surgeon: Christena Flake, MD;  Location: [facility] ORS;  Service: Orthopedics;  Laterality: Right;   SHOULDER ARTHROSCOPY WITH DEBRIDEMENT AND BICEP TENDON REPAIR Right 05/13/2016   Procedure: SHOULDER ARTHROSCOPY WITH DEBRIDEMENT AND BICEP TENDON REPAIR;  Surgeon: Christena Flake, MD;  Location: [facility] ORS;  Service: Orthopedics;  Laterality: Right;  SHOULDER ARTHROSCOPY WITH OPEN ROTATOR CUFF REPAIR Right 05/13/2016   Procedure: SHOULDER ARTHROSCOPY WITH OPEN ROTATOR CUFF REPAIR;  Surgeon: Christena Flake, MD;  Location: [facility] ORS;  Service: Orthopedics;  Laterality: Right;   SHOULDER ARTHROSCOPY WITH OPEN  ROTATOR CUFF REPAIR Right 03/17/2017   Procedure: SHOULDER ARTHROSCOPY WITH OPEN ROTATOR CUFF REPAIR;  Surgeon: Christena Flake, MD;  Location: [facility] ORS;  Service: Orthopedics;  Laterality: Right;  arthroscopic debridement    SINUS IRRIGATION      SOCIAL HISTORY: Social History   Socioeconomic History   Marital status: Legally Separated    Spouse name: Not on file   Number of children: Not on file   Years of education: Not on file   Highest education level: Not on file  Occupational History   Not on file  Tobacco Use   Smoking status: Never   Smokeless tobacco: Never  Vaping Use   Vaping status: Never Used  Substance and Sexual Activity   Alcohol use: No   Drug use: No   Sexual activity: Yes  Other Topics Concern   Not on file  Social History Narrative   Not on file   Social Drivers of Health   Financial Resource Strain: Not on file  Food Insecurity: Food Insecurity Present (04/17/2023)   Hunger Vital Sign    Worried About Running Out of Food in the Last Year: Never true    Ran Out of Food in the Last Year: Sometimes true  Transportation Needs: Unmet Transportation Needs (04/17/2023)   PRAPARE - Administrator, Civil Service (Medical): Yes    Lack of Transportation (Non-Medical): Yes  Physical Activity: Not on file  Stress: Not on file  Social Connections: Not on file  Intimate Partner Violence: Not At Risk (04/17/2023)   Humiliation, Afraid, Rape, and Kick questionnaire    Fear of Current or Ex-Partner: No    Emotionally Abused: No    Physically Abused: No    Sexually Abused: No    FAMILY HISTORY: Family History  Problem Relation Age of Onset   Multiple myeloma Mother    Heart attack Father     ALLERGIES:  is allergic to latex.  MEDICATIONS:  Current Outpatient Medications  Medication Sig Dispense Refill   acetaminophen (TYLENOL) 500 MG tablet Take 1 tablet (500 mg total) by mouth every 6 (six) hours as needed for moderate pain (pain score 4-6) or  headache. 30 tablet 0   albuterol (VENTOLIN HFA) 108 (90 Base) MCG/ACT inhaler Inhale 2 puffs into the lungs every 4 (four) hours as needed. 8 g 0   ferrous sulfate 325 (65 FE) MG tablet Take 1 tablet (325 mg total) by mouth 2 (two) times daily with a meal. 60 tablet 3   furosemide (LASIX) 20 MG tablet Take 1 tablet (20 mg total) by mouth 2 (two) times daily. 60 tablet 2   hydrOXYzine (ATARAX) 25 MG tablet Take 25 mg by mouth daily.     sertraline (ZOLOFT) 50 MG tablet Take 50 mg by mouth daily.     No current facility-administered medications for this visit.     PHYSICAL EXAMINATION:   Vitals:   06/15/23 1031  BP: 120/68  Pulse: 80  Resp: 18  Temp: 98.7 F (37.1 C)  SpO2: 100%   Filed Weights   06/15/23 1031  Weight: 236 lb (107 kg)    Physical Exam Vitals and nursing note reviewed.  HENT:     Head: Normocephalic and atraumatic.  Mouth/Throat:     Pharynx: Oropharynx is clear.  Eyes:     Extraocular Movements: Extraocular movements intact.     Pupils: Pupils are equal, round, and reactive to light.  Cardiovascular:     Rate and Rhythm: Normal rate and regular rhythm.  Pulmonary:     Comments: Decreased breath sounds bilaterally.  Abdominal:     Palpations: Abdomen is soft.  Musculoskeletal:        General: Normal range of motion.     Cervical back: Normal range of motion.  Skin:    General: Skin is warm.  Neurological:     General: No focal deficit present.     Mental Status: She is alert and oriented to person, place, and time.  Psychiatric:        Behavior: Behavior normal.        Judgment: Judgment normal.      LABORATORY DATA:  I have reviewed the data as listed Lab Results  Component Value Date   WBC 8.4 06/15/2023   HGB 10.8 (L) 06/15/2023   HCT 34.7 (L) 06/15/2023   MCV 89.4 06/15/2023   PLT 503 (H) 06/15/2023   Recent Labs    04/09/23 1951 04/10/23 0515 04/12/23 0823 05/25/23 2034 06/15/23 1011  NA  --    < > 137 134* 139  K  --     < > 3.8 3.8 3.4*  CL  --    < > 100 99 101  CO2  --    < > 25 23 28   GLUCOSE  --    < > 91 102* 97  BUN  --    < > 19 15 17   CREATININE  --    < > 0.65 0.72 0.77  CALCIUM  --    < > 9.1 9.3 9.1  GFRNONAA  --    < > >60 >60 >60  PROT 7.1  --   --   --   --   ALBUMIN 3.8  --   --   --   --   AST 24  --   --   --   --   ALT 23  --   --   --   --   ALKPHOS 56  --   --   --   --   BILITOT 0.6  --   --   --   --   BILIDIR <0.1  --   --   --   --   IBILI NOT CALCULATED  --   --   --   --    < > = values in this interval not displayed.     DG Chest 2 View Result Date: 05/25/2023 CLINICAL DATA:  Chest pain with shortness of breath and cough. EXAM: CHEST - 2 VIEW COMPARISON:  April 09, 2023 FINDINGS: The heart size and mediastinal contours are within normal limits. There is no evidence of acute infiltrate, pleural effusion or pneumothorax. A stable, moderate to large hiatal hernia is noted. The visualized skeletal structures are unremarkable. IMPRESSION: 1. No active cardiopulmonary disease. 2. Moderate to large hiatal hernia. Electronically Signed   By: Aram Candela M.D.   On: 05/25/2023 21:32    ASSESSMENT & PLAN:   Symptomatic anemia # Severe Anemia- [Feb 2025- Hb 4.6] -Hb-symptomatic.  Likely due to iron deficiency. Oral iron: started in FEB 2025 [? Abdominal cramps]- tolerating well.   S/p IV iron infusion/Venofer-   #  Mild B12- deficiency [  FEB 2025-218]- recommend OTC Sublingula B12.    #Etiology of iron deficiency: S/p GI evaluation-  colonoscopy- on [facility] [Dr.Russow] on April 3rd, 2025-multiple duodenal/gastric erosions.  Awaiting GI evaluation.   # CHF- diastolic- Dr.Alluri [[facility]]; s/p  appt.   # DISPOSITION: # venofer today # follow up 3  months-- MD; labs- cbc/bmp; iron sudies; ferritin- possible venofer- Dr.B    All questions were answered. The patient knows to call the clinic with any problems, questions or concerns.    Gwyn Leos, MD 06/15/2023  11:15 AM

## 2023-06-15 NOTE — Assessment & Plan Note (Addendum)
#   Severe Anemia- [Feb 2025- Hb 4.6] -Hb-symptomatic.  Likely due to iron deficiency. Oral iron: started in FEB 2025 [? Abdominal cramps]- tolerating well.   S/p IV iron infusion/Venofer-   #  Mild B12- deficiency [FEB 2025-218]- recommend OTC Sublingula B12.    #Etiology of iron deficiency: S/p GI evaluation-  colonoscopy- on [facility] [Dr.Russow] on April 3rd, 2025-multiple duodenal/gastric erosions.  Awaiting GI evaluation.   # CHF- diastolic- Dr.Alluri [[facility]]; s/p  appt.   # DISPOSITION: # venofer today # follow up 3  months-- MD; labs- cbc/bmp; iron sudies; ferritin- possible venofer- Dr.B

## 2023-06-18 ENCOUNTER — Ambulatory Visit: Admitting: Pulmonary Disease

## 2023-06-18 ENCOUNTER — Encounter: Payer: Self-pay | Admitting: Pulmonary Disease

## 2023-06-18 VITALS — BP 116/62 | HR 98 | Temp 97.7°F | Ht 63.0 in | Wt 237.8 lb

## 2023-06-18 DIAGNOSIS — K449 Diaphragmatic hernia without obstruction or gangrene: Secondary | ICD-10-CM

## 2023-06-18 DIAGNOSIS — R053 Chronic cough: Secondary | ICD-10-CM

## 2023-06-18 DIAGNOSIS — D500 Iron deficiency anemia secondary to blood loss (chronic): Secondary | ICD-10-CM | POA: Diagnosis not present

## 2023-06-18 DIAGNOSIS — K2100 Gastro-esophageal reflux disease with esophagitis, without bleeding: Secondary | ICD-10-CM | POA: Diagnosis not present

## 2023-06-18 LAB — NITRIC OXIDE: Nitric Oxide: 18

## 2023-06-18 NOTE — Progress Notes (Signed)
 Subjective:    Patient ID: Sydney Collins, female    DOB: 04-17-1960, 63 y.o.   MRN: [id number]  Patient Care Team: Earna Coder, MD as Consulting Physician (Oncology)  Chief Complaint  Patient presents with   Consult    Cough with clear liquid. Shortness of breath on exertion.     BACKGROUND: Patient is a 64 year old lifelong never smoker who presents for evaluation of a cough productive of copious "clear liquid" and some shortness of breath on exertion.  She is kindly referred by Jodi Marble, NP   HPI Discussed the use of AI scribe software for clinical note transcription with the patient, who gave verbal consent to proceed.  History of Present Illness   Sydney Collins is a 63 year old female who presents with a persistent cough.  She initially became ill in December and was treated with antibiotics. By February, she experienced persistent shortness of breath and was diagnosed with pneumonia at an urgent care visit. Subsequently, she was advised to go to the emergency room due to fluid in her lungs and was found to have a hemoglobin of 4.3, leading to a diagnosis of heart failure.  This appears to have been due to high-output failure.  She describes significant shortness of breath, particularly after exertion, such as walking out of [employer], but her main concern is that of the cough.  Recently, she had bronchitis and required a re-evaluation of her lungs on her birthday, [birth year], to proceed with a scheduled colonoscopy on April 3rd. She continues to experience a cough, which she describes as producing a fluid-like, non-yellow, non-thick sputum. The cough persists even when she is lying down, despite her habit of sleeping in an inclined position due to previous rotator cuff surgeries.  She has gained 23 pounds of weight since her hospitalization and is currently on diuretics. She has a history of reflux and is on pantoprazole, which she takes twice daily, although it was  initially prescribed once daily before meals. She recalls being informed of a possible hiatal hernia, multiple ulcers, and diverticulitis following an endoscopy in February, but has not had a follow-up consultation to discuss these findings.  I reviewed the report of the findings with the patient and noted that she has a 5 cm hiatal hernia.  Her family history includes her mother having COPD and bone marrow cancer, as well as multiple hernias. 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 She has a history of significant weight gain, approximately 40 pounds over the past ten years, which she attributes to [place]   She has not had any recent fevers, chills or sweats.  No sputum production per se with the cough.  She describes the "fluid" of what she brings up after coughing.  No hemoptysis.    Review of Systems A 10 point review of systems was performed and it is as noted above otherwise negative.   Past Medical History:  Diagnosis Date   Anemia    H/O   Arthritis    Complication of anesthesia    could feel everything when she was being extubated   Diastolic heart failure (HCC)    GERD (gastroesophageal reflux disease)    H/O   History of chickenpox    Hypertension    H/O OVER A SHORT PERIOD OF TIME- LOST WEIGHT AND IS NO LONGER TAKING BP MED PER PCP  Pre-diabetes    Primary osteoarthritis of right shoulder    Sleep apnea 1999   WAS TOLD SHE STOPPED BREATHING 80 TIMES DURING THE NIGHT.  NEVER WAS GIVEN CPAP MACHINE PER PT    Past Surgical History:  Procedure Laterality Date   APPENDECTOMY     COLONOSCOPY WITH PROPOFOL N/A 06/04/2023   Procedure: COLONOSCOPY WITH PROPOFOL;  Surgeon: Quintin Buckle, DO;  Location: [facility] ENDOSCOPY;  Service: Gastroenterology;  Laterality: N/A;   ESOPHAGOGASTRODUODENOSCOPY (EGD) WITH PROPOFOL N/A 06/04/2023   Procedure: ESOPHAGOGASTRODUODENOSCOPY  (EGD) WITH PROPOFOL;  Surgeon: Quintin Buckle, DO;  Location: [facility] ENDOSCOPY;  Service: Gastroenterology;  Laterality: N/A;   SHOULDER ARTHROSCOPY Right 06/05/2015   Procedure: ARTHROSCOPY SHOULDER;  Surgeon: Elner Hahn, MD;  Location: [facility] ORS;  Service: Orthopedics;  Laterality: Right;   SHOULDER ARTHROSCOPY WITH DEBRIDEMENT AND BICEP TENDON REPAIR Right 05/13/2016   Procedure: SHOULDER ARTHROSCOPY WITH DEBRIDEMENT AND BICEP TENDON REPAIR;  Surgeon: Elner Hahn, MD;  Location: [facility] ORS;  Service: Orthopedics;  Laterality: Right;   SHOULDER ARTHROSCOPY WITH OPEN ROTATOR CUFF REPAIR Right 05/13/2016   Procedure: SHOULDER ARTHROSCOPY WITH OPEN ROTATOR CUFF REPAIR;  Surgeon: Elner Hahn, MD;  Location: [facility] ORS;  Service: Orthopedics;  Laterality: Right;   SHOULDER ARTHROSCOPY WITH OPEN ROTATOR CUFF REPAIR Right 03/17/2017   Procedure: SHOULDER ARTHROSCOPY WITH OPEN ROTATOR CUFF REPAIR;  Surgeon: Elner Hahn, MD;  Location: [facility] ORS;  Service: Orthopedics;  Laterality: Right;  arthroscopic debridement    SINUS IRRIGATION      Patient Active Problem List   Diagnosis Date Noted   GERD without esophagitis 04/10/2023   Acute CHF (HCC) 04/10/2023   Iron deficiency anemia 04/10/2023   Symptomatic anemia 04/09/2023    Family History  Problem Relation Age of Onset   Multiple myeloma Mother    Heart attack Father     Social History   Tobacco Use   Smoking status: Never   Smokeless tobacco: Never  Substance Use Topics   Alcohol use: No    Allergies  Allergen Reactions   Latex Other (See Comments)    blisters    Current Meds  Medication Sig   acetaminophen (TYLENOL) 500 MG tablet Take 1 tablet (500 mg total) by mouth every 6 (six) hours as needed for moderate pain (pain score 4-6) or headache.   albuterol (VENTOLIN HFA) 108 (90 Base) MCG/ACT inhaler Inhale 2 puffs into the lungs every 4 (four) hours as needed.   ferrous sulfate 325 (65 FE) MG tablet Take 1 tablet (325 mg  total) by mouth 2 (two) times daily with a meal.   furosemide (LASIX) 20 MG tablet Take 1 tablet (20 mg total) by mouth 2 (two) times daily.   hydrOXYzine (ATARAX) 25 MG tablet Take 25 mg by mouth daily.   sertraline (ZOLOFT) 50 MG tablet Take 50 mg by mouth daily.    Immunization History  Administered Date(s) Administered   Influenza,inj,Quad PF,6+ Mos 03/12/2022   Rsv, Bivalent, Protein Subunit Rsvpref,pf Pattricia Bores) 03/12/2022        Objective:     BP 116/62 (BP Location: Right Arm, Patient Position: Sitting, Cuff Size: Normal)   Pulse 98   Temp 97.7 F (36.5 C) (Temporal)   Ht 5\' 3"  (1.6 m)   Wt 237 lb 12.8 oz (107.9 kg)   SpO2 98%   BMI 42.12 kg/m   SpO2: 98 %  GENERAL: Obese woman, no acute distress, fully ambulatory, no conversational dyspnea. HEAD: Normocephalic, atraumatic.  EYES:  Pupils equal, round, reactive to light.  No scleral icterus.  MOUTH: Partial upper dentures, oral mucosa moist.  No thrush. NECK: Supple. No thyromegaly. Trachea midline. No JVD.  No adenopathy. PULMONARY: Good air entry bilaterally.  No adventitious sounds. CARDIOVASCULAR: S1 and S2. Regular rate and rhythm.  No rubs, murmurs or gallops heard. ABDOMEN: Obese, otherwise benign. MUSCULOSKELETAL: No joint deformity, no clubbing, no edema.  NEUROLOGIC: No overt focal deficit, no gait disturbance, speech is fluent. SKIN: Intact,warm,dry.  Very tanned. PSYCH: Mood and behavior normal.  Lab Results  Component Value Date   NITRICOXIDE 18 06/18/2023  *No evidence of type II inflammation or (IL-13, eosinophilic)  Chest x-ray obtained 25 May 2023 showing large hiatal hernia (arrows):   Assessment & Plan:     ICD-10-CM   1. Hiatal hernia with gastroesophageal reflux disease and esophagitis  K44.9 Ambulatory referral to General Surgery   K21.00     2. Iron deficiency anemia due to chronic blood loss  D50.0 Ambulatory referral to General Surgery    3. Cough, persistent  R05.3        Orders Placed This Encounter  Procedures   Ambulatory referral to General Surgery    Referral Priority:   Routine    Referral Type:   Surgical    Referral Reason:   Specialty Services Required    Referred to Provider:   Alben Alma, MD    Requested Specialty:   General Surgery    Number of Visits Requested:   1   Discussion:    Hiatal Hernia A large hiatal hernia, approximately 5 cm, is causing significant symptoms including cough, dyspnea, and reflux. It contributes to anemia due to chronic blood loss from erosions. Present since at least 2015, it has increased in size, with symptoms likely due to gastric contents entering the lungs. Weight loss may alleviate symptoms, but surgical repair will likely be necessary as the hernia can enlarge, exacerbating symptoms.  She is status post upper endoscopy 04 June 2023. - Refer to Dr. Dana Duncan for surgical evaluation - Continue pantoprazole 40 mg twice daily - Advise weight loss - Recommend use of a wedge pillow for sleeping  Cough Chronic cough is likely due to reflux associated with the hiatal hernia. She reports coughing up non-thick, non-yellow fluid, suggesting gastric contents. Reflux into the lungs can cause respiratory issues. - Continue pantoprazole 40 mg twice daily - Recommend use of a wedge pillow for sleeping  Reflux Significant reflux symptoms are likely exacerbated by the large hiatal hernia. She experiences heartburn even without eating and is on pantoprazole. Adjusting the medication to twice daily may help manage symptoms. - Continue pantoprazole 40 mg twice daily - Recommend use of a wedge pillow for sleeping  Anemia Anemia is likely secondary to chronic blood loss from erosions associated with the hiatal hernia, contributing to heart failure symptoms.  Follow-up Follow-up is necessary to monitor progress and response to treatment. Referral to Dr. Dana Duncan for surgical evaluation is pending. - Schedule follow-up  appointment in two months      Advised if symptoms do not improve or worsen, to please contact office for sooner follow up or seek emergency care.    I spent 46 minutes of dedicated to the care of this patient on the date of this encounter to include pre-visit review of records, face-to-face time with the patient discussing conditions above, post visit ordering of testing, clinical documentation with the electronic health record, making appropriate referrals as documented, and communicating necessary  findings to members of the patients care team.   C. Chloe Counter, MD [facility]  [facility]    *This note was dictated using voice recognition software/Dragon.  Despite best efforts to proofread, errors can occur which can change the meaning. Any transcriptional errors that result from this process are unintentional and may not be fully corrected at the time of dictation.

## 2023-06-18 NOTE — Patient Instructions (Addendum)
 VISIT SUMMARY:  During your visit, we discussed your persistent cough, shortness of breath, and recent weight gain. We reviewed your history of pneumonia, heart failure, and reflux, and examined the impact of your large hiatal hernia on your symptoms. We also addressed your anemia and the need for a surgical evaluation.  YOUR PLAN:  -HIATAL HERNIA: A hiatal hernia occurs when part of the stomach pushes up through the diaphragm. Your hernia is causing significant symptoms like cough, shortness of breath, and reflux, and is contributing to anemia due to chronic blood loss. We recommend continuing pantoprazole 40 mg twice daily, using a wedge pillow for sleeping, and pursuing weight loss. A referral to Dr. Dana Duncan for a surgical evaluation has been made.  -COUGH: Your chronic cough is likely due to reflux associated with the hiatal hernia, causing gastric contents to enter your lungs. We recommend continuing pantoprazole 40 mg twice daily and using a wedge pillow for sleeping.  -REFLUX: Reflux occurs when stomach acid flows back into the esophagus, causing heartburn and other symptoms. Your reflux is likely worsened by the large hiatal hernia. We recommend continuing pantoprazole 40 mg twice daily and using a wedge pillow for sleeping.  -ANEMIA: Anemia is a condition where you don't have enough healthy red blood cells to carry adequate oxygen to your body's tissues. Your anemia is likely due to chronic blood loss from the erosions caused by the hiatal hernia. This is contributing to your heart failure symptoms.  INSTRUCTIONS:  Please schedule a follow-up appointment in two months to monitor your progress and response to treatment. Additionally, follow up with Dr. Dana Duncan for a surgical evaluation of your hiatal hernia.

## 2023-06-25 ENCOUNTER — Encounter: Payer: Self-pay | Admitting: Internal Medicine

## 2023-06-29 ENCOUNTER — Ambulatory Visit: Payer: Self-pay | Admitting: Surgery

## 2023-06-29 ENCOUNTER — Encounter: Payer: Self-pay | Admitting: Surgery

## 2023-06-29 VITALS — BP 126/61 | HR 68 | Temp 98.0°F | Ht 63.0 in | Wt 239.0 lb

## 2023-06-29 DIAGNOSIS — K449 Diaphragmatic hernia without obstruction or gangrene: Secondary | ICD-10-CM

## 2023-06-29 NOTE — Patient Instructions (Signed)
 Follow up here in 3 months.   Please call and ask to speak with a nurse if you develop questions or concerns.  Hiatal Hernia  A hiatal hernia occurs when part of the stomach slides above the muscle that separates the abdomen from the chest (diaphragm). A person can be born with a hiatal hernia (congenital), or it may develop over time. In almost all cases of hiatal hernia, only the top part of the stomach pushes through the diaphragm. Many people have a hiatal hernia with no symptoms. The larger the hernia, the more likely it is that you will have symptoms. In some cases, a hiatal hernia allows stomach acid to flow back into the tube that carries food from your mouth to your stomach (esophagus). This may cause heartburn symptoms. The development of heartburn symptoms may mean that you have a condition called gastroesophageal reflux disease (GERD). What are the causes? This condition is caused by a weakness in the opening (hiatus) where the esophagus passes through the diaphragm to attach to the upper part of the stomach. A person may be born with a weakness in the hiatus, or a weakness can develop over time. What increases the risk? This condition is more likely to develop in: Older people. Age is a major risk factor for a hiatal hernia, especially if you are over the age of 87. Pregnant women. People who are overweight. People who have frequent constipation. What are the signs or symptoms? Symptoms of this condition usually develop in the form of GERD symptoms. Symptoms include: Heartburn. Upset stomach (indigestion). Trouble swallowing. Coughing or wheezing. Wheezing is making high-pitched whistling sounds when you breathe. Sore throat. Chest pain. Nausea and vomiting. How is this diagnosed? This condition may be diagnosed during testing for GERD. Tests that may be done include: X-rays of your stomach or chest. An upper gastrointestinal (GI) series. This is an X-ray exam of your GI  tract that is taken after you swallow a chalky liquid that shows up clearly on the X-ray. Endoscopy. This is a procedure to look into your stomach using a thin, flexible tube that has a tiny camera and light on the end of it. How is this treated? This condition may be treated by: Dietary and lifestyle changes to help reduce GERD symptoms. Medicines. These may include: Over-the-counter antacids. Medicines that make your stomach empty more quickly. Medicines that block the production of stomach acid (H2 blockers). Stronger medicines to reduce stomach acid (proton pump inhibitors). Surgery to repair the hernia, if other treatments are not helping. If you have no symptoms, you may not need treatment. Follow these instructions at home: Lifestyle and activity Do not use any products that contain nicotine or tobacco. These products include cigarettes, chewing tobacco, and vaping devices, such as e-cigarettes. If you need help quitting, ask your health care provider. Try to achieve and maintain a healthy body weight. Avoid putting pressure on your abdomen. Anything that puts pressure on your abdomen increases the amount of acid that may be pushed up into your esophagus. Avoid bending over, especially after eating. Raise the head of your bed by putting blocks under the legs. This keeps your head and esophagus higher than your stomach. Do not wear tight clothing around your chest or stomach. Try not to strain when having a bowel movement, when urinating, or when lifting heavy objects. Eating and drinking Avoid foods that can worsen GERD symptoms. These may include: Fatty foods, like fried foods. Citrus fruits, like oranges or lemon. Other  foods and drinks that contain acid, like orange juice or tomatoes. Spicy food. Chocolate. Eat frequent small meals instead of three large meals a day. This helps prevent your stomach from getting too full. Eat slowly. Do not lie down right after eating. Do not  eat 1-2 hours before bed. Do not drink beverages with caffeine. These include cola, coffee, cocoa, and tea. Do not drink alcohol. General instructions Take over-the-counter and prescription medicines only as told by your health care provider. Keep all follow-up visits. Your health care provider will want to check that any new prescribed medicines are helping your symptoms. Contact a health care provider if: Your symptoms are not controlled with medicines or lifestyle changes. You are having trouble swallowing. You have coughing or wheezing that will not go away. Your pain is getting worse. Your pain spreads to your arms, neck, jaw, teeth, or back. You feel nauseous or you vomit. Get help right away if: You have shortness of breath. You vomit blood. You have bright red blood in your stools. You have black, tarry stools. These symptoms may be an emergency. Get help right away. Call 911. Do not wait to see if the symptoms will go away. Do not drive yourself to the hospital. Summary A hiatal hernia occurs when part of the stomach slides above the muscle that separates the abdomen from the chest. A person may be born with a weakness in the hiatus, or a weakness can develop over time. Symptoms of a hiatal hernia may include heartburn, trouble swallowing, or sore throat. Management of a hiatal hernia includes eating frequent small meals instead of three large meals a day. Get help right away if you vomit blood, have bright red blood in your stools, or have black, tarry stools. This information is not intended to replace advice given to you by your health care provider. Make sure you discuss any questions you have with your health care provider. Document Revised: 04/16/2021 Document Reviewed: 04/16/2021 Elsevier Patient Education  2024 [employer].

## 2023-06-29 NOTE — Progress Notes (Unsigned)
 Patient ID: Sydney Collins, female   DOB: Jul 12, 1960, 62 y.o.   MRN: [id number]  HPI Sydney Collins is a 63 y.o. female seen in consultation at the request of Dr. Viva Grise for a large symptomatic hiatal hernia  She moved back to [place]  Reflux and regurgitation recalcitrant to medical rx.  She also endorses significant productive cough Recent hospitalization for heart failure due to severe anemia. Her family history includes her mother having COPD and bone marrow cancer, as well as multiple hernias. 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  She did have a recent chest x-ray that I have personally reviewed showing a moderate to large hiatal hernia. SHe also underwent a recent upper endoscopy showing evidence of gastritis and confirming a large hiatal hernia. Most recent hemoglobin is 10.8 with normal platelets, CMP was completely normal, recent echo revealed preserved left ventricular function HPI  Past Medical History:  Diagnosis Date   Anemia    H/O   Arthritis    Complication of anesthesia    could feel everything when she was being extubated   Diastolic heart failure (HCC)    GERD (gastroesophageal reflux disease)    H/O   History of chickenpox    Hypertension    H/O OVER A SHORT PERIOD OF TIME- LOST WEIGHT AND IS NO LONGER TAKING BP MED PER PCP   Pre-diabetes    Primary osteoarthritis of right shoulder    Sleep apnea 1999   WAS TOLD SHE STOPPED BREATHING 80 TIMES DURING THE NIGHT.  NEVER WAS GIVEN CPAP MACHINE PER PT    Past Surgical History:  Procedure Laterality Date   APPENDECTOMY     COLONOSCOPY WITH PROPOFOL  N/A 06/04/2023   Procedure: COLONOSCOPY WITH PROPOFOL ;  Surgeon: Quintin Buckle, DO;  Location: [facility];  Service: Gastroenterology;  Laterality: N/A;   ESOPHAGOGASTRODUODENOSCOPY (EGD) WITH PROPOFOL  N/A 06/04/2023   Procedure: ESOPHAGOGASTRODUODENOSCOPY (EGD) WITH PROPOFOL ;  Surgeon: Quintin Buckle, DO;  Location: [facility]  ENDOSCOPY;  Service: Gastroenterology;  Laterality: N/A;   SHOULDER ARTHROSCOPY Right 06/05/2015   Procedure: ARTHROSCOPY SHOULDER;  Surgeon: Elner Hahn, MD;  Location: [facility] ORS;  Service: Orthopedics;  Laterality: Right;   SHOULDER ARTHROSCOPY WITH DEBRIDEMENT AND BICEP TENDON REPAIR Right 05/13/2016   Procedure: SHOULDER ARTHROSCOPY WITH DEBRIDEMENT AND BICEP TENDON REPAIR;  Surgeon: Elner Hahn, MD;  Location: [facility] ORS;  Service: Orthopedics;  Laterality: Right;   SHOULDER ARTHROSCOPY WITH OPEN ROTATOR CUFF REPAIR Right 05/13/2016   Procedure: SHOULDER ARTHROSCOPY WITH OPEN ROTATOR CUFF REPAIR;  Surgeon: Elner Hahn, MD;  Location: [facility] ORS;  Service: Orthopedics;  Laterality: Right;   SHOULDER ARTHROSCOPY WITH OPEN ROTATOR CUFF REPAIR Right 03/17/2017   Procedure: SHOULDER ARTHROSCOPY WITH OPEN ROTATOR CUFF REPAIR;  Surgeon: Elner Hahn, MD;  Location: [facility] ORS;  Service: Orthopedics;  Laterality: Right;  arthroscopic debridement    SINUS IRRIGATION      Family History  Problem Relation Age of Onset   Multiple myeloma Mother    Heart attack Father     Social History Social History   Tobacco Use   Smoking status: Never    Passive exposure: Never   Smokeless tobacco: Never  Vaping Use   Vaping status: Never Used  Substance Use Topics   Alcohol use: No   Drug use: No    Allergies  Allergen Reactions   Latex Other (See Comments)    blisters    Current Outpatient Medications  Medication Sig Dispense Refill   acetaminophen  (TYLENOL ) 500 MG tablet Take 1 tablet (500 mg total) by mouth every 6 (six) hours as needed for moderate pain (pain score 4-6) or headache. 30 tablet 0   albuterol  (VENTOLIN  HFA) 108 (90 Base) MCG/ACT inhaler Inhale 2 puffs into the lungs every 4 (four) hours as needed. 8 g 0   ferrous sulfate  325 (65 FE) MG tablet Take 1 tablet (325 mg total) by mouth 2 (two) times daily with a meal. 60 tablet 3   furosemide  (LASIX ) 20 MG tablet Take 1 tablet (20 mg  total) by mouth 2 (two) times daily. 60 tablet 2   hydrOXYzine (ATARAX) 25 MG tablet Take 25 mg by mouth daily.     pantoprazole  (PROTONIX ) 40 MG tablet Take 40 mg by mouth 2 (two) times daily.     sertraline (ZOLOFT) 50 MG tablet Take 50 mg by mouth daily.     No current facility-administered medications for this visit.     Review of Systems Full ROS  was asked and was negative except for the information on the HPI  Physical Exam Blood pressure 126/61, pulse 68, temperature 98 F (36.7 C), height 5\' 3"  (1.6 m), weight 239 lb (108.4 kg), SpO2 97%. CONSTITUTIONAL: NAD. EYES: Pupils are equal, round, and reactive to light, Sclera are non-icteric. EARS, NOSE, MOUTH AND THROAT: The oropharynx is clear. The oral mucosa is pink and moist. Hearing is intact to voice. LYMPH NODES:  Lymph nodes in the neck are normal. RESPIRATORY:  Lungs are clear. There is normal respiratory effort, with equal breath sounds bilaterally, and without pathologic use of accessory muscles. CARDIOVASCULAR: Heart is regular without murmurs, gallops, or rubs. GI: The abdomen is  soft, nontender, and nondistended. There are no palpable masses. There is no hepatosplenomegaly. There are normal bowel sounds in all quadrants. GU: Rectal deferred.   MUSCULOSKELETAL: Normal muscle strength and tone. No cyanosis or edema.   SKIN: Turgor is good and there are no pathologic skin lesions or ulcers. NEUROLOGIC: Motor and sensation is grossly normal. Cranial nerves are grossly intact. PSYCH:  Oriented to person, place and time. Affect is normal.  Data Reviewed  I have personally reviewed the patient's imaging, laboratory findings and medical records.    Assessment/Plan Symptomatic large hiatal hernia on the patient with a BMI of 42.  I had an extensive discussion with patient regarding her disease process.  I do think that we will need to fix this large hernia that is symptomatic but prior to this we will need to optimize her  weight. Her Weight goal is 200 lbs and that will get her to BMI 35.  Offer options to referral to weight management versus going back to primary care.  Patient is very motivated and she is willing to do diet exercise and talk to primary care physician about potential medications.  We will need to perform a barium swallow as well as a CT scan of the abdomen and pelvis to further delineate the anatomy.  There is no need for immediate surgical intervention at this time. Please note that I spent 60 minutes in this encounter including extensive review of medical records, personally reviewing imaging studies, coordinating her care, placing orders and performing documentation  Evelia Hipp, MD FACS General Surgeon 06/29/2023, 2:24 PM

## 2023-06-30 ENCOUNTER — Other Ambulatory Visit: Payer: Self-pay

## 2023-06-30 DIAGNOSIS — K449 Diaphragmatic hernia without obstruction or gangrene: Secondary | ICD-10-CM

## 2023-06-30 DIAGNOSIS — R1310 Dysphagia, unspecified: Secondary | ICD-10-CM

## 2023-07-09 ENCOUNTER — Ambulatory Visit
Admission: RE | Admit: 2023-07-09 | Discharge: 2023-07-09 | Disposition: A | Discharge status: Home / Self Care | Source: Ambulatory Visit | Attending: Surgery | Admitting: Surgery

## 2023-07-09 DIAGNOSIS — R1310 Dysphagia, unspecified: Secondary | ICD-10-CM | POA: Insufficient documentation

## 2023-07-09 DIAGNOSIS — K449 Diaphragmatic hernia without obstruction or gangrene: Secondary | ICD-10-CM

## 2023-07-09 MED ORDER — IOHEXOL 300 MG/ML  SOLN
100.0000 mL | Freq: Once | INTRAMUSCULAR | Status: AC | PRN
  Administered 2023-07-09: 100 mL via INTRAVENOUS

## 2023-07-15 ENCOUNTER — Other Ambulatory Visit
Admission: RE | Admit: 2023-07-15 | Discharge: 2023-07-15 | Disposition: A | Discharge status: Home / Self Care | Source: Ambulatory Visit | Attending: Surgery | Admitting: Surgery

## 2023-07-15 ENCOUNTER — Ambulatory Visit: Admitting: Surgery

## 2023-07-15 ENCOUNTER — Ambulatory Visit
Admission: RE | Admit: 2023-07-15 | Discharge: 2023-07-15 | Disposition: A | Discharge status: Home / Self Care | Source: Ambulatory Visit | Attending: Surgery | Admitting: Surgery

## 2023-07-15 ENCOUNTER — Encounter: Payer: Self-pay | Admitting: Surgery

## 2023-07-15 VITALS — BP 132/70 | HR 71 | Ht 63.0 in | Wt 237.0 lb

## 2023-07-15 DIAGNOSIS — K449 Diaphragmatic hernia without obstruction or gangrene: Secondary | ICD-10-CM | POA: Diagnosis not present

## 2023-07-15 DIAGNOSIS — R0789 Other chest pain: Secondary | ICD-10-CM | POA: Insufficient documentation

## 2023-07-15 LAB — COMPREHENSIVE METABOLIC PANEL WITH GFR
ALT: 14 U/L (ref 0–44)
AST: 18 U/L (ref 15–41)
Albumin: 3.7 g/dL (ref 3.5–5.0)
Alkaline Phosphatase: 57 U/L (ref 38–126)
Anion gap: 7 (ref 5–15)
BUN: 17 mg/dL (ref 8–23)
CO2: 21 mmol/L — ABNORMAL LOW (ref 22–32)
Calcium: 8.8 mg/dL — ABNORMAL LOW (ref 8.9–10.3)
Chloride: 110 mmol/L (ref 98–111)
Creatinine, Ser: 0.78 mg/dL (ref 0.44–1.00)
GFR, Estimated: >60 mL/min (ref >60)
Glucose, Bld: 91 mg/dL (ref 70–99)
Potassium: 4 mmol/L (ref 3.5–5.1)
Sodium: 138 mmol/L (ref 135–145)
Total Bilirubin: 0.4 mg/dL (ref 0.0–1.2)
Total Protein: 7 g/dL (ref 6.5–8.1)

## 2023-07-15 LAB — CBC WITH DIFFERENTIAL/PLATELET
Abs Immature Granulocytes: 0 10*3/uL (ref 0.00–0.07)
Basophils Absolute: 0.1 10*3/uL (ref 0.0–0.1)
Basophils Relative: 1 %
Eosinophils Absolute: 0.1 10*3/uL (ref 0.0–0.5)
Eosinophils Relative: 2 %
HCT: 33.6 % — ABNORMAL LOW (ref 36.0–46.0)
Hemoglobin: 10.7 g/dL — ABNORMAL LOW (ref 12.0–15.0)
Immature Granulocytes: 0 %
Lymphocytes Relative: 30 %
Lymphs Abs: 1.3 10*3/uL (ref 0.7–4.0)
MCH: 28.1 pg (ref 26.0–34.0)
MCHC: 31.8 g/dL (ref 30.0–36.0)
MCV: 88.2 fL (ref 80.0–100.0)
Monocytes Absolute: 0.3 10*3/uL (ref 0.1–1.0)
Monocytes Relative: 7 %
Neutro Abs: 2.7 10*3/uL (ref 1.7–7.7)
Neutrophils Relative %: 60 %
Platelets: 430 10*3/uL — ABNORMAL HIGH (ref 150–400)
RBC: 3.81 MIL/uL — ABNORMAL LOW (ref 3.87–5.11)
RDW: 15 % (ref 11.5–15.5)
WBC: 4.4 10*3/uL (ref 4.0–10.5)
nRBC: 0 % (ref 0.0–0.2)

## 2023-07-15 LAB — TROPONIN I (HIGH SENSITIVITY): Troponin I (High Sensitivity): 4 ng/L (ref <18)

## 2023-07-15 LAB — PROTIME-INR
INR: 1 (ref 0.8–1.2)
Prothrombin Time: 13 s (ref 11.4–15.2)

## 2023-07-15 NOTE — Patient Instructions (Addendum)
 We will schedule you for an EKG at [facility], you will have this today at 3:30 pm. Enter in through the [facility] entrance and report to the first desk on your right. After you get registered let them know you also have lab work that needs to be done.    We will call you with your results.    Follow up here in 2 months.

## 2023-07-15 NOTE — Progress Notes (Unsigned)
 Outpatient Surgical Follow Up  07/15/2023  Sydney Collins is an 63 y.o. female.   Chief Complaint  Patient presents with   Follow-up    HPI: Sydney Collins is a 63 y.o. female seen follow-up  large symptomatic hiatal hernia Reflux and regurgitation recalcitrant to medical rx.  She also endorses significant productive cough Most recently she is endorsing retrosternal pain pressure type, specific alleviating or aggravating factors.  Seems to be atypical did complete her CT and barium swallow that I have personally reviewed and shared with her.  There is a very large paraesophageal hernia type III there is also some difficulty distal esophagus passing barium tablet although no evidence of filling defects.  Recent hospitalization for heart failure due to severe anemia. Her family history includes her mother having COPD and bone marrow cancer, as well as multiple hernias. 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  She did have a recent chest x-ray that I have personally reviewed showing a moderate to large hiatal hernia. SHe also underwent a recent upper endoscopy showing evidence of gastritis and confirming a large hiatal hernia. Most recent hemoglobin is 10.8 with normal platelets, CMP was completely normal, recent echo revealed preserved left ventricular function  Past Medical History:  Diagnosis Date   Anemia    H/O   Arthritis    Complication of anesthesia    could feel everything when she was being extubated   Diastolic heart failure (HCC)    GERD (gastroesophageal reflux disease)    H/O   History of chickenpox    Hypertension    H/O OVER A SHORT PERIOD OF TIME- LOST WEIGHT AND IS NO LONGER TAKING BP MED PER PCP   Pre-diabetes    Primary osteoarthritis of right shoulder    Sleep apnea 1999   WAS TOLD SHE STOPPED BREATHING 80 TIMES DURING THE NIGHT.  NEVER WAS GIVEN CPAP MACHINE PER PT    Past Surgical History:  Procedure Laterality Date   APPENDECTOMY     COLONOSCOPY WITH  PROPOFOL  N/A 06/04/2023   Procedure: COLONOSCOPY WITH PROPOFOL ;  Surgeon: Quintin Buckle, DO;  Location: [facility] ENDOSCOPY;  Service: Gastroenterology;  Laterality: N/A;   ESOPHAGOGASTRODUODENOSCOPY (EGD) WITH PROPOFOL  N/A 06/04/2023   Procedure: ESOPHAGOGASTRODUODENOSCOPY (EGD) WITH PROPOFOL ;  Surgeon: Quintin Buckle, DO;  Location: [facility] ENDOSCOPY;  Service: Gastroenterology;  Laterality: N/A;   SHOULDER ARTHROSCOPY Right 06/05/2015   Procedure: ARTHROSCOPY SHOULDER;  Surgeon: Elner Hahn, MD;  Location: [facility] OR[facility];  Service: Orthopedics;  Laterality: Right;   SHOULDER ARTHROSCOPY WITH DEBRIDEMENT AND BICEP TENDON REPAIR Right 05/13/2016   Procedure: SHOULDER ARTHROSCOPY WITH DEBRIDEMENT AND BICEP TENDON REPAIR;  Surgeon: Elner Hahn, MD;  Location: [facility] ORS;  Service: Orthopedics;  Laterality: Right;   SHOULDER ARTHROSCOPY WITH OPEN ROTATOR CUFF REPAIR Right 05/13/2016   Procedure: SHOULDER ARTHROSCOPY WITH OPEN ROTATOR CUFF REPAIR;  Surgeon: Elner Hahn, MD;  Location: [facility] ORS;  Service: Orthopedics;  Laterality: Right;   SHOULDER ARTHROSCOPY WITH OPEN ROTATOR CUFF REPAIR Right 03/17/2017   Procedure: SHOULDER ARTHROSCOPY WITH OPEN ROTATOR CUFF REPAIR;  Surgeon: Elner Hahn, MD;  Location: [facility] ORS;  Service: Orthopedics;  Laterality: Right;  arthroscopic debridement    SINUS IRRIGATION      Family History  Problem Relation Age of Onset   Multiple myeloma Mother    Heart attack Father     Social History:  reports that she has never smoked. She has never been exposed to tobacco smoke. She  has never used smokeless tobacco. She reports that she does not drink alcohol and does not use drugs.  Allergies:  Allergies  Allergen Reactions   Latex Other (See Comments)    blisters    Medications reviewed.    ROS Full ROS performed and is otherwise negative other than what is stated in HPI   BP 132/70   Pulse 71   Ht 5\' 3"  (1.6 m)   Wt 237 lb (107.5 kg)   SpO2 98%   BMI 41.98  kg/m   Physical Exam CONSTITUTIONAL: NAD. EYES: Pupils are equal, round, and reactive to light, Sclera are non-icteric. EARS, NOSE, MOUTH AND THROAT: The oropharynx is clear. The oral mucosa is pink and moist. Hearing is intact to voice. LYMPH NODES:  Lymph nodes in the neck are normal. RESPIRATORY:  Lungs are clear. There is normal respiratory effort, with equal breath sounds bilaterally, and without pathologic use of accessory muscles. CARDIOVASCULAR: Heart is regular without murmurs, gallops, or rubs. GI: The abdomen is  soft, nontender, and nondistended. There are no palpable masses. There is no hepatosplenomegaly. There are normal bowel sounds in all quadrants. GU: Rectal deferred.   MUSCULOSKELETAL: Normal muscle strength and tone. No cyanosis or edema.   SKIN: Turgor is good and there are no pathologic skin lesions or ulcers. NEUROLOGIC: Motor and sensation is grossly normal. Cranial nerves are grossly intact. PSYCH:  Oriented to person, place and time. Affect is normal.   Assessment/Plan: 63 year old female with symptomatic paraesophageal hernia.  Once again we talked about her disease process.  She is definitely not quite ready for surgical intervention as we need to optimize her BMI.  I am more concerned about her atypical chest pain.  Discussed with her in detail and I advised her to going to the emergency room.  Patient politely declines.  I we will order at least lab work including troponin and an EKG at least to have a baseline.  Another etiology of her atypical chest pain could certainly be the paraesophageal hernia. She is currently not classically behaving as a myocardial infarction.  We will follow her shortly and will let her know about workup.  I also encouraged her and advised her to see primary care as soon as possible so they can make sure that this atypical chest pain is not cardiogenic in origin.  She verbalized understanding.  Please note that I have spent 40 minutes in  this encounter including extensive review of medical records, personally reviewing imaging studies, coordinating her care, placing orders and performing documentation  Evelia Hipp, MD [facility] General Surgeon

## 2023-08-17 ENCOUNTER — Emergency Department
Admission: EM | Admit: 2023-08-17 | Discharge: 2023-08-17 | Disposition: A | Discharge status: Home / Self Care | Attending: Emergency Medicine | Admitting: Emergency Medicine

## 2023-08-17 ENCOUNTER — Emergency Department

## 2023-08-17 ENCOUNTER — Other Ambulatory Visit: Payer: Self-pay

## 2023-08-17 DIAGNOSIS — I5030 Unspecified diastolic (congestive) heart failure: Secondary | ICD-10-CM | POA: Insufficient documentation

## 2023-08-17 DIAGNOSIS — D1723 Benign lipomatous neoplasm of skin and subcutaneous tissue of right leg: Secondary | ICD-10-CM | POA: Diagnosis not present

## 2023-08-17 DIAGNOSIS — R0602 Shortness of breath: Secondary | ICD-10-CM | POA: Diagnosis present

## 2023-08-17 LAB — BASIC METABOLIC PANEL WITH GFR
Anion gap: 10 (ref 5–15)
BUN: 16 mg/dL (ref 8–23)
CO2: 24 mmol/L (ref 22–32)
Calcium: 9.1 mg/dL (ref 8.9–10.3)
Chloride: 105 mmol/L (ref 98–111)
Creatinine, Ser: 0.74 mg/dL (ref 0.44–1.00)
GFR, Estimated: >60 mL/min (ref >60)
Glucose, Bld: 156 mg/dL — ABNORMAL HIGH (ref 70–99)
Potassium: 3.1 mmol/L — ABNORMAL LOW (ref 3.5–5.1)
Sodium: 139 mmol/L (ref 135–145)

## 2023-08-17 LAB — CBC
HCT: 35.2 % — ABNORMAL LOW (ref 36.0–46.0)
Hemoglobin: 10.8 g/dL — ABNORMAL LOW (ref 12.0–15.0)
MCH: 26.6 pg (ref 26.0–34.0)
MCHC: 30.7 g/dL (ref 30.0–36.0)
MCV: 86.7 fL (ref 80.0–100.0)
Platelets: 413 10*3/uL — ABNORMAL HIGH (ref 150–400)
RBC: 4.06 MIL/uL (ref 3.87–5.11)
RDW: 14.4 % (ref 11.5–15.5)
WBC: 5.3 10*3/uL (ref 4.0–10.5)
nRBC: 0 % (ref 0.0–0.2)

## 2023-08-17 LAB — TROPONIN I (HIGH SENSITIVITY): Troponin I (High Sensitivity): 4 ng/L (ref <18)

## 2023-08-17 LAB — BRAIN NATRIURETIC PEPTIDE: B Natriuretic Peptide: 145.8 pg/mL — ABNORMAL HIGH (ref 0.0–100.0)

## 2023-08-17 NOTE — ED Notes (Signed)
 MD at bedside.

## 2023-08-17 NOTE — ED Triage Notes (Addendum)
 Pt comes with c/o sob and leg swelling. Pt states this all started week ago. Pt has been dx with CHF earlier this year. Pt states she is on lasix  and has been double dosing. Pt states leg swelling and pain at her right knee and a knot. Pt states not sure if blood clot.

## 2023-08-17 NOTE — ED Notes (Signed)
 Pt returned from xray

## 2023-08-17 NOTE — ED Notes (Signed)
 RN Jacqlyn Matas informed of pt coming to room.

## 2023-08-17 NOTE — ED Notes (Signed)
 Pt verbalizes understanding of discharge instructions. Opportunity for questioning and answers were provided. Pt discharged from ED to home.   ? ?

## 2023-08-17 NOTE — ED Provider Notes (Signed)
 [facility] Provider Note    Event Date/Time   First MD Initiated Contact with Patient 08/17/23 [phone]     (approximate)   History   Shortness of Breath   HPI  Sydney Collins is a 63 y.o. female with a history of sleep apnea, anemia, GERD, obesity, CHF who presents with increased shortness of breath over the last several days associated with bilateral leg pain and some swelling around her ankles.  The patient states that for the last few days she has doubled her Lasix  from 20 to 40 mg daily.  She denies any cough or fever.  She has no chest pain.  She also reports a knot on her right thigh which she just noticed but is not particularly painful.  I reviewed the past medical records.  The patient was admitted to the hospitalist service in February with anemia and acute diastolic CHF.  Her most recent outpatient encounter was on 5/14 with surgery for follow-up of a hiatal hernia.   Physical Exam   Triage Vital Signs: ED Triage Vitals  Encounter Vitals Group     BP 08/17/23 0846 (!) 155/105     Girls Systolic BP Percentile --      Girls Diastolic BP Percentile --      Boys Systolic BP Percentile --      Boys Diastolic BP Percentile --      Pulse Rate 08/17/23 0846 98     Resp 08/17/23 0846 19     Temp 08/17/23 0846 98 F (36.7 C)     Temp src --      SpO2 08/17/23 0846 94 %     Weight 08/17/23 0845 228 lb (103.4 kg)     Height 08/17/23 0845 5' 3 (1.6 m)     Head Circumference --      Peak Flow --      Pain Score 08/17/23 0845 5     Pain Loc --      Pain Education --      Exclude from Growth Chart --     Most recent vital signs: Vitals:   08/17/23 1000 08/17/23 1030  BP: 115/80 117/83  Pulse: (!) 56 (!) 59  Resp: 19 20  Temp:    SpO2: 97% 95%     General: Alert, comfortable appearing, no distress.  CV:  Good peripheral perfusion.  Resp:  Normal effort.  Lungs CTAB. Abd:  No distention.  Other:  Trace bilateral lower extremity edema.   No calf or popliteal swelling or tenderness.  Approximately 2 cm superficial soft tissue mass to the right thigh with no overlying erythema or induration.  Nontender.  No fluctuance.   ED Results / Procedures / Treatments   Labs (all labs ordered are listed, but only abnormal results are displayed) Labs Reviewed  BASIC METABOLIC PANEL WITH GFR - Abnormal; Notable for the following components:      Result Value   Potassium 3.1 (*)    Glucose, Bld 156 (*)    All other components within normal limits  CBC - Abnormal; Notable for the following components:   Hemoglobin 10.8 (*)    HCT 35.2 (*)    Platelets 413 (*)    All other components within normal limits  BRAIN NATRIURETIC PEPTIDE - Abnormal; Notable for the following components:   B Natriuretic Peptide 145.8 (*)    All other components within normal limits  TROPONIN I (HIGH SENSITIVITY)     EKG  ED  ECG REPORT I, Lind Repine, the attending physician, personally viewed and interpreted this ECG.  Date: 08/17/2023 EKG Time: 0847 Rate: 82 Rhythm: normal sinus rhythm QRS Axis: normal Intervals: normal ST/T Wave abnormalities: Nonspecific T wave abnormalities Narrative Interpretation: no evidence of acute ischemia    RADIOLOGY  Chest x-ray: I independently viewed and interpreted the images; there is no focal consolidation or edema  US  venous LE bilateral: No acute DVT.  Right thigh mass consistent with lipoma.  PROCEDURES:  Critical Care performed: No  Procedures   MEDICATIONS ORDERED IN ED: Medications - No data to display   IMPRESSION / MDM / ASSESSMENT AND PLAN / ED COURSE  I reviewed the triage vital signs and the nursing notes.  63 year old female with PMH as noted above presents with increased shortness of breath and ankle swelling over the last several days.  She has doubled her Lasix  without relief.  On exam the patient is well-appearing.  O2 saturation is in the mid 90s on room air.  She does not  demonstrate any acute respiratory distress.  There are no rales on lung exam.  There is trace bilateral lower extremity edema.  The patient also reports a soft tissue mass in her right thigh which is nontender.  Differential diagnosis includes, but is not limited to, CHF exacerbation, acute bronchitis, pneumonia.  I have a low suspicion for DVT.  Given the lack tachycardia, hypoxia, or chest pain, there is no clinical evidence for PE.  We will obtain chest x-ray, bilateral lower extremity ultrasound, labs, and reassess.  Patient's presentation is most consistent with acute complicated illness / injury requiring diagnostic workup.  The patient is on the cardiac monitor to evaluate for evidence of arrhythmia and/or significant heart rate changes.   ----------------------------------------- 11:52 AM on 08/17/2023 -----------------------------------------  Chest x-ray is clear.  BNP is not significantly elevated.  Troponin is negative.  BMP and CBC show no acute findings.  Ultrasound is negative for acute DVT.  The right thigh lesion is most consistent with a lipoma, which agrees with the clinical presentation.  At this time, there is no evidence of an acute CHF exacerbation.  The patient feels well.  She is stable for discharge home, and is comfortable going home at this time.  I counseled her on the results of the workup.  She may resume her normal Lasix  dose.  She should follow-up with her primary care provider.  Given strict return precautions, she expressed understanding.  FINAL CLINICAL IMPRESSION(S) / ED DIAGNOSES   Final diagnoses:  Shortness of breath  Lipoma of right lower extremity     Rx / DC Orders   ED Discharge Orders     None        Note:  This document was prepared using Dragon voice recognition software and may include unintentional dictation errors.    Lind Repine, MD 08/17/23 1153

## 2023-08-17 NOTE — Discharge Instructions (Signed)
 This time, your workup is not showing a heart failure exacerbation.  There is no fluid in the lungs on your x-ray, and your oxygen level is normal.  There is no signs of a blood clot in your leg.  The mass on your right thigh appears to be a lipoma which is a type of growth of fat.  This just needs to be followed by your regular doctor, especially if it changes or increases in size.  Return to the ER for new, worsening, or persistent severe shortness of breath, chest pain, weakness or lightheadedness, leg swelling, or any other new or worsening symptoms that concern you.

## 2023-08-19 ENCOUNTER — Other Ambulatory Visit
Admission: RE | Admit: 2023-08-19 | Discharge: 2023-08-19 | Disposition: A | Discharge status: Home / Self Care | Source: Ambulatory Visit | Attending: Pulmonary Disease | Admitting: Pulmonary Disease

## 2023-08-19 ENCOUNTER — Ambulatory Visit (INDEPENDENT_AMBULATORY_CARE_PROVIDER_SITE_OTHER): Admitting: Pulmonary Disease

## 2023-08-19 ENCOUNTER — Encounter: Payer: Self-pay | Admitting: Pulmonary Disease

## 2023-08-19 VITALS — BP 132/80 | HR 74 | Temp 98.1°F | Ht 63.0 in | Wt 242.0 lb

## 2023-08-19 DIAGNOSIS — M25562 Pain in left knee: Secondary | ICD-10-CM | POA: Insufficient documentation

## 2023-08-19 DIAGNOSIS — K449 Diaphragmatic hernia without obstruction or gangrene: Secondary | ICD-10-CM

## 2023-08-19 DIAGNOSIS — K2100 Gastro-esophageal reflux disease with esophagitis, without bleeding: Secondary | ICD-10-CM

## 2023-08-19 DIAGNOSIS — E876 Hypokalemia: Secondary | ICD-10-CM

## 2023-08-19 DIAGNOSIS — E8770 Fluid overload, unspecified: Secondary | ICD-10-CM | POA: Diagnosis not present

## 2023-08-19 DIAGNOSIS — D500 Iron deficiency anemia secondary to blood loss (chronic): Secondary | ICD-10-CM | POA: Insufficient documentation

## 2023-08-19 DIAGNOSIS — M25561 Pain in right knee: Secondary | ICD-10-CM | POA: Insufficient documentation

## 2023-08-19 DIAGNOSIS — R739 Hyperglycemia, unspecified: Secondary | ICD-10-CM

## 2023-08-19 DIAGNOSIS — R0602 Shortness of breath: Secondary | ICD-10-CM | POA: Insufficient documentation

## 2023-08-19 LAB — SEDIMENTATION RATE: Sed Rate: 41 mm/h — ABNORMAL HIGH (ref 0–30)

## 2023-08-19 LAB — CBC WITH DIFFERENTIAL/PLATELET
Abs Immature Granulocytes: 0.01 10*3/uL (ref 0.00–0.07)
Basophils Absolute: 0.1 10*3/uL (ref 0.0–0.1)
Basophils Relative: 1 %
Eosinophils Absolute: 0.2 10*3/uL (ref 0.0–0.5)
Eosinophils Relative: 3 %
HCT: 32.6 % — ABNORMAL LOW (ref 36.0–46.0)
Hemoglobin: 10.4 g/dL — ABNORMAL LOW (ref 12.0–15.0)
Immature Granulocytes: 0 %
Lymphocytes Relative: 22 %
Lymphs Abs: 1.2 10*3/uL (ref 0.7–4.0)
MCH: 27.4 pg (ref 26.0–34.0)
MCHC: 31.9 g/dL (ref 30.0–36.0)
MCV: 86 fL (ref 80.0–100.0)
Monocytes Absolute: 0.3 10*3/uL (ref 0.1–1.0)
Monocytes Relative: 5 %
Neutro Abs: 3.7 10*3/uL (ref 1.7–7.7)
Neutrophils Relative %: 69 %
Platelets: 416 10*3/uL — ABNORMAL HIGH (ref 150–400)
RBC: 3.79 MIL/uL — ABNORMAL LOW (ref 3.87–5.11)
RDW: 14.4 % (ref 11.5–15.5)
WBC: 5.4 10*3/uL (ref 4.0–10.5)
nRBC: 0 % (ref 0.0–0.2)

## 2023-08-19 LAB — C-REACTIVE PROTEIN: CRP: 0.6 mg/dL (ref <1.0)

## 2023-08-19 MED ORDER — POTASSIUM CHLORIDE CRYS ER 20 MEQ PO TBCR: 20.0000 meq | EXTENDED_RELEASE_TABLET | Freq: Two times a day (BID) | ORAL | 0 refills | Status: AC

## 2023-08-19 MED ORDER — FUROSEMIDE 20 MG PO TABS: 20.0000 mg | ORAL_TABLET | Freq: Two times a day (BID) | ORAL | 0 refills | Status: AC

## 2023-08-19 NOTE — Patient Instructions (Signed)
 VISIT SUMMARY:  Sydney Collins, a 63 year old female, visited today due to shortness of breath, leg pain, and concerns about fluid retention. She has a history of anemia, a large hiatal hernia, and arthritis. She also reported a recent weight fluctuation and elevated blood sugar levels.  YOUR PLAN:  -SHORTNESS OF BREATH: Your shortness of breath is likely related to your anemia. We will refill your diuretic for one month to help with your symptoms and have ordered pulmonary function tests to further evaluate your breathing.  -ANEMIA: Anemia means you have a lower than normal number of red blood cells, which can cause symptoms like shortness of breath. Your hemoglobin level is currently 10.8 g/dL. We will continue to monitor this condition.  -HYPOKALEMIA: Hypokalemia means you have low potassium levels, which can be caused by diuretic use. We will prescribe potassium supplements to prevent further imbalance.  -LEG PAIN: Your leg pain, especially near the knee, may be related to arthritis. Since NSAIDs are not an option, we recommend acetaminophen  for pain relief and will order a rheumatoid arthritis panel to further investigate.  -HYPERGLYCEMIA: Hyperglycemia means you have higher than normal blood sugar levels. Your blood sugar was 156 mg/dL, and we recommend monitoring this with your primary care provider.  -GENERAL HEALTH MAINTENANCE: You are scheduled for a routine follow-up with your primary care provider in July. Continue with your current lifestyle and dietary habits, and we will reassess your condition during your next visit.  INSTRUCTIONS:  Please follow up in four to six weeks to reassess your symptoms and test results. Contact us  if you experience any difficulties before your scheduled follow-up.

## 2023-08-19 NOTE — Progress Notes (Signed)
 Subjective:    Patient ID: Sydney Collins, female    DOB: 1960/12/10, 63 y.o.   MRN: [id number]  Patient Care Team: [facility] as PCP - General Valentine Gasmen, Donnald Fuss, MD as Consulting Physician (Oncology)  Chief Complaint  Patient presents with   Follow-up    Shortness of breath on exertion.     BACKGROUND/INTERVAL:Patient is a 63 year old lifelong never smoker who presents for follow-up of  shortness of breath on exertion.  She was initially evaluated here on 18 June 2023.  Shortness of breath was believed to have been multifactorial particularly due to severe anemia, large hiatal hernia and obesity with deconditioning.  HPI Discussed the use of AI scribe software for clinical note transcription with the patient, who gave verbal consent to proceed.  History of Present Illness   Sydney Collins is a 63 year old female with anemia and a large hiatal hernia who presents with shortness of breath.  She experiences shortness of breath, it is likely associated with her known anemia and large hiatal hernia. She describes difficulty breathing and notes that her symptoms prompted her to visit the emergency room, where she was told she was 'good' after a five-hour wait. She is concerned about her breathing and mentions that she has not been able to breathe well.  She reports significant leg pain, particularly near her knee, which she describes as severe. She mentions a 'knot' in the area, which was evaluated in the emergency room and determined to be a fatty deposit.  I have reviewed her ultrasound evaluation from the emergency room and this showed no evidence of DVT.  The pain extends down to her toe and is severe enough to wake her at night. She has a history of arthritis in one knee, confirmed by x-ray, but has not had the other knee evaluated. She was previously taking meloxicam 4 mg, which was effective, but is no longer able to take NSAIDs due to her hiatal hernia.  The pain  is mostly bilateral, knees mostly involved.  She has a history of fluid retention and was previously prescribed diuretics during a hospital stay. The diuretics were effective in reducing her leg and ankle swelling and proving her dyspnea. However, she has run out of her 'fluid pill' and is unable to see her primary care physician until July 7th. She reports a weight fluctuation of 20 pounds and states she has 'barely been eating at all.'  Since she quit taking the diuretic she has noted increased shortness of breath.  Her past medical history includes anemia, with a current hemoglobin level of 10.8.  I have reviewed her recent laboratory data from the ED and it also showed that she has some hyperglycemia and hypokalemia (3.1). Her blood sugar was noted to be 156, which needs monitoring.  She denies any history of smoking. She works late hours, often until 10 PM, and typically eats a sandwich before going to bed.  She was advised this is not best given reflux issues.   Review of Systems A 10 point review of systems was performed and it is as noted above otherwise negative.   Patient Active Problem List   Diagnosis Date Noted   GERD without esophagitis 04/10/2023   Acute CHF (HCC) 04/10/2023   Iron  deficiency anemia 04/10/2023   Symptomatic anemia 04/09/2023    Social History   Tobacco Use   Smoking status: Never    Passive exposure: Never   Smokeless tobacco: Never  Substance  Use Topics   Alcohol use: No    Allergies  Allergen Reactions   Latex Other (See Comments)    blisters    Current Meds  Medication Sig   acetaminophen  (TYLENOL ) 500 MG tablet Take 1 tablet (500 mg total) by mouth every 6 (six) hours as needed for moderate pain (pain score 4-6) or headache.   albuterol  (VENTOLIN  HFA) 108 (90 Base) MCG/ACT inhaler Inhale 2 puffs into the lungs every 4 (four) hours as needed.   ferrous sulfate  325 (65 FE) MG tablet Take 1 tablet (325 mg total) by mouth 2 (two) times daily  with a meal.   hydrOXYzine (ATARAX) 25 MG tablet Take 25 mg by mouth daily.   pantoprazole  (PROTONIX ) 40 MG tablet Take 40 mg by mouth 2 (two) times daily.   potassium chloride  SA (KLOR-CON  M) 20 MEQ tablet Take 1 tablet (20 mEq total) by mouth 2 (two) times daily.   sertraline (ZOLOFT) 50 MG tablet Take 50 mg by mouth daily.    Immunization History  Administered Date(s) Administered   Influenza,inj,Quad PF,6+ Mos 03/12/2022   Rsv, Bivalent, Protein Subunit Rsvpref,pf Pattricia Bores) 03/12/2022        Objective:     BP 132/80 (BP Location: Left Arm, Patient Position: Sitting, Cuff Size: Large)   Pulse 74   Temp 98.1 F (36.7 C) (Oral)   Ht 5' 3 (1.6 m)   Wt 242 lb (109.8 kg)   SpO2 97%   BMI 42.87 kg/m   SpO2: 97 %  GENERAL: Obese woman, no acute distress, fully ambulatory, no conversational dyspnea. HEAD: Normocephalic, atraumatic.  EYES: Pupils equal, round, reactive to light.  No scleral icterus.  MOUTH: Partial upper dentures, oral mucosa moist.  No thrush. NECK: Supple. No thyromegaly. Trachea midline. No JVD.  No adenopathy. PULMONARY: Good air entry bilaterally.  No adventitious sounds. CARDIOVASCULAR: S1 and S2. Regular rate and rhythm.  No rubs, murmurs or gallops heard. ABDOMEN: Obese, otherwise benign. MUSCULOSKELETAL: No joint deformity, no clubbing, no edema.  NEUROLOGIC: No overt focal deficit, no gait disturbance, speech is fluent. SKIN: Intact,warm,dry.  Very tanned. PSYCH: Mood and behavior normal.   Ambulatory oxymetry was performed today:  At rest on room air oxygen saturation was 100%, the patient ambulated at a normal pace, completed 3 laps, O2 nadir 97%, moderate shortness of breath.  Resting heart rate was 69 bpm at maximum for this exercise 125 bpm. *No significant O2 desaturation noted during exercise.  Heart rate increased very quickly to 125 bpm even with minimal exertion.  This may indicate deconditioning or cardiac etiology for exercise  limitation.      Assessment & Plan:     ICD-10-CM   1. Shortness of breath  R06.02 Pulmonary function test    CBC with Differential/Platelet    2. Hiatal hernia with gastroesophageal reflux disease and esophagitis  K44.9    K21.00     3. Iron  deficiency anemia due to chronic blood loss  D50.0 CBC with Differential/Platelet    4. Hypervolemia, unspecified hypervolemia type  E87.70     5. Hypokalemia  E87.6     6. Arthralgia of both lower legs  M25.561 ANA,IFA RA Diag Pnl w/rflx Tit/Patn   M25.562 C-reactive protein    Sedimentation rate    CANCELED: Sedimentation rate    CANCELED: C-reactive protein    7. Hyperglycemia  R73.9       Orders Placed This Encounter  Procedures   Sedimentation rate    Standing Status:  Future    Expiration Date:   08/18/2024   C-reactive protein    Standing Status:   Future    Expiration Date:   08/18/2024   ANA,IFA RA Diag Pnl w/rflx Tit/Patn    Standing Status:   Future    Expiration Date:   08/18/2024   CBC with Differential/Platelet    Standing Status:   Future    Expected Date:   08/19/2023    Expiration Date:   08/18/2024   Pulmonary function test    Standing Status:   Future    Expected Date:   09/02/2023    Expiration Date:   08/18/2024    Where should this test be performed?:   Outpatient Pulmonary    What type of PFT is being ordered?:   Full PFT    Meds ordered this encounter  Medications   furosemide  (LASIX ) 20 MG tablet    Sig: Take 1 tablet (20 mg total) by mouth 2 (two) times daily.    Dispense:  60 tablet    Refill:  0   potassium chloride  SA (KLOR-CON  M) 20 MEQ tablet    Sig: Take 1 tablet (20 mEq total) by mouth 2 (two) times daily.    Dispense:  60 tablet    Refill:  0   Discussion:    Shortness of breath Shortness of breath associated with anemia. Diuretics have previously alleviated symptoms in view of volume overload.  Other potential causes: Deconditioning, obesity with obesity hypoventilation. - Refill  diuretic for one month until primary care follow-up. - Order pulmonary function tests.  Anemia Chronic anemia with hemoglobin at 10.8 g/dL, contributing to shortness of breath and elevated platelet count as a compensatory response.  Hypokalemia Hypokalemia likely secondary to diuretic use. Potassium supplementation is necessary to prevent further electrolyte imbalance. - Prescribe potassium supplementation.  Leg pain/arthralgia Chronic leg pain, particularly near the knee, possibly related to arthritis. Pain is severe enough to disrupt sleep. Previous imaging showed arthritis in one leg; the other leg remains unevaluated. NSAIDs are contraindicated. - Order rheumatoid arthritis panel. - Recommend acetaminophen  for pain management.  Hyperglycemia Marginal hyperglycemia with blood glucose at 156 mg/dL, requiring monitoring and follow-up with primary care.  General Health Maintenance Scheduled for routine follow-up with primary care provider in July.  Follow-Up Follow-up in four to six weeks to reassess symptoms and test results. - Contact if difficulties arise before the scheduled follow-up.      Advised if symptoms do not improve or worsen, to please contact office for sooner follow up or seek emergency care.    I spent 42 minutes of dedicated to the care of this patient on the date of this encounter to include pre-visit review of records, face-to-face time with the patient discussing conditions above, post visit ordering of testing, clinical documentation with the electronic health record, making appropriate referrals as documented, and communicating necessary findings to members of the patients care team.     C. Chloe Counter, MD [facility]    *This note was generated using voice recognition software/Dragon and/or AI transcription program.  Despite best efforts to proofread, errors can occur which can change the meaning. Any  transcriptional errors that result from this process are unintentional and may not be fully corrected at the time of dictation.

## 2023-08-25 LAB — ANTINUCLEAR ANTIBODIES, IFA: ANA Ab, IFA: NEGATIVE

## 2023-09-09 ENCOUNTER — Ambulatory Visit: Admitting: Surgery

## 2023-09-09 ENCOUNTER — Encounter: Payer: Self-pay | Admitting: Surgery

## 2023-09-09 VITALS — BP 129/91 | HR 90 | Temp 98.0°F | Ht 63.0 in | Wt 239.0 lb

## 2023-09-09 DIAGNOSIS — K449 Diaphragmatic hernia without obstruction or gangrene: Secondary | ICD-10-CM | POA: Diagnosis not present

## 2023-09-09 NOTE — Patient Instructions (Signed)
 We sent you a referral to [facility], they will call you with an appointment.  Also call your PCP to get an appointment  Please call the office if you have any questions or concerns

## 2023-09-09 NOTE — Progress Notes (Signed)
 Outpatient Surgical Follow Up  09/09/2023  Sydney Collins is an 63 y.o. female.    HPI: Sydney Collins is a 63 y.o. female seen follow-up  large symptomatic hiatal hernia Reflux and regurgitation recalcitrant to medical rx.  She also endorses significant productive cough Most recently she is endorsing retrosternal pain pressure type, specific alleviating or aggravating factors.  Seems to be atypical did complete her CT and barium swallow that I have personally reviewed and shared with her.  There is a very large paraesophageal hernia type III there is also some difficulty distal esophagus passing barium tablet although no evidence of filling defects.  Her family history includes her mother having COPD and bone marrow cancer, as well as multiple hernias. 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  She did have a recent chest x-ray that I have personally reviewed showing a moderate to large hiatal hernia. SHe also underwent a recent upper endoscopy showing evidence of gastritis and confirming a large hiatal hernia. She gained 2 lbs since our last visit. She went back to diuretics. No other chest pains Recent CXR pers reviewed also shows large Hiatal hernia    Past Medical History:  Diagnosis Date   Anemia    H/O   Arthritis    Complication of anesthesia    could feel everything when she was being extubated   Diastolic heart failure (HCC)    GERD (gastroesophageal reflux disease)    H/O   History of chickenpox    Hypertension    H/O OVER A SHORT PERIOD OF TIME- LOST WEIGHT AND IS NO LONGER TAKING BP MED PER PCP   Pre-diabetes    Primary osteoarthritis of right shoulder    Sleep apnea 1999   WAS TOLD SHE STOPPED BREATHING 80 TIMES DURING THE NIGHT.  NEVER WAS GIVEN CPAP MACHINE PER PT    Past Surgical History:  Procedure Laterality Date   APPENDECTOMY     COLONOSCOPY WITH PROPOFOL  N/A 06/04/2023   Procedure: COLONOSCOPY WITH PROPOFOL ;  Surgeon: Onita Elspeth Sharper, DO;  Location:  [facility] ENDOSCOPY;  Service: Gastroenterology;  Laterality: N/A;   ESOPHAGOGASTRODUODENOSCOPY (EGD) WITH PROPOFOL  N/A 06/04/2023   Procedure: ESOPHAGOGASTRODUODENOSCOPY (EGD) WITH PROPOFOL ;  Surgeon: Onita Elspeth Sharper, DO;  Location: [facility] ENDOSCOPY;  Service: Gastroenterology;  Laterality: N/A;   SHOULDER ARTHROSCOPY Right 06/05/2015   Procedure: ARTHROSCOPY SHOULDER;  Surgeon: Norleen JINNY Maltos, MD;  Location: [facility] ORS;  Service: Orthopedics;  Laterality: Right;   SHOULDER ARTHROSCOPY WITH DEBRIDEMENT AND BICEP TENDON REPAIR Right 05/13/2016   Procedure: SHOULDER ARTHROSCOPY WITH DEBRIDEMENT AND BICEP TENDON REPAIR;  Surgeon: Norleen JINNY Maltos, MD;  Location: [facility] ORS;  Service: Orthopedics;  Laterality: Right;   SHOULDER ARTHROSCOPY WITH OPEN ROTATOR CUFF REPAIR Right 05/13/2016   Procedure: SHOULDER ARTHROSCOPY WITH OPEN ROTATOR CUFF REPAIR;  Surgeon: Norleen JINNY Maltos, MD;  Location: [facility] ORS;  Service: Orthopedics;  Laterality: Right;   SHOULDER ARTHROSCOPY WITH OPEN ROTATOR CUFF REPAIR Right 03/17/2017   Procedure: SHOULDER ARTHROSCOPY WITH OPEN ROTATOR CUFF REPAIR;  Surgeon: Maltos Norleen JINNY, MD;  Location: [facility] ORS;  Service: Orthopedics;  Laterality: Right;  arthroscopic debridement    SINUS IRRIGATION      Family History  Problem Relation Age of Onset   Multiple myeloma Mother    Heart attack Father     Social History:  reports that she has never smoked. She has never been exposed to tobacco smoke. She has never used smokeless tobacco. She reports that she does not  drink alcohol and does not use drugs.  Allergies:  Allergies  Allergen Reactions   Latex Other (See Comments)    blisters    Medications reviewed.    ROS Full ROS performed and is otherwise negative other than what is stated in HPI   BP (!) 129/91   Pulse 90   Temp 98 F (36.7 C) (Oral)   Ht 5' 3 (1.6 m)   Wt 239 lb (108.4 kg)   SpO2 98%   BMI 42.34 kg/m   Physical Exam CONSTITUTIONAL: NAD. EYES: Pupils are equal,  round, and reactive to light, Sclera are non-icteric. EARS, NOSE, MOUTH AND THROAT: The oropharynx is clear. The oral mucosa is pink and moist. Hearing is intact to voice. LYMPH NODES:  Lymph nodes in the neck are normal. RESPIRATORY:  Lungs are clear. There is normal respiratory effort, with equal breath sounds bilaterally, and without pathologic use of accessory muscles. CARDIOVASCULAR: Heart is regular without murmurs, gallops, or rubs. GI: The abdomen is  soft, nontender, and nondistended. There are no palpable masses. There is no hepatosplenomegaly. There are normal bowel sounds in all quadrants. GU: Rectal deferred.   MUSCULOSKELETAL: Normal muscle strength and tone. No cyanosis or edema.   SKIN: Turgor is good and there are no pathologic skin lesions or ulcers. NEUROLOGIC: Motor and sensation is grossly normal. Cranial nerves are grossly intact. PSYCH:  Oriented to person, place and time. Affect is normal.  Assessment/Plan: 63 yo paraesophageal w BMI 42, d/w her the importance of weight optimization, another alternative is bariatric surgery and potential concomitant HH repair w gastric bypass. She is interested in this options. We will refer to [facility] bariatrics.  I personally spent a total of 30 minutes in the care of the patient today including performing a medically appropriate exam/evaluation, counseling and educating, placing orders, referring and communicating with other health care professionals, documenting clinical information in the EHR, independently interpreting and reviewing images studies and coordinating care.   Laneta Luna, MD [facility] General Surgeon

## 2023-09-15 ENCOUNTER — Inpatient Hospital Stay: Attending: Internal Medicine

## 2023-09-15 ENCOUNTER — Encounter: Payer: Self-pay | Admitting: Internal Medicine

## 2023-09-15 ENCOUNTER — Inpatient Hospital Stay

## 2023-09-15 ENCOUNTER — Inpatient Hospital Stay (HOSPITAL_BASED_OUTPATIENT_CLINIC_OR_DEPARTMENT_OTHER): Admitting: Internal Medicine

## 2023-09-15 VITALS — BP 117/76 | HR 72 | Resp 16

## 2023-09-15 VITALS — BP 120/84 | HR 87 | Temp 96.1°F | Resp 16 | Ht 63.0 in | Wt 240.0 lb

## 2023-09-15 DIAGNOSIS — E538 Deficiency of other specified B group vitamins: Secondary | ICD-10-CM | POA: Insufficient documentation

## 2023-09-15 DIAGNOSIS — E669 Obesity, unspecified: Secondary | ICD-10-CM | POA: Diagnosis not present

## 2023-09-15 DIAGNOSIS — D649 Anemia, unspecified: Secondary | ICD-10-CM | POA: Diagnosis not present

## 2023-09-15 DIAGNOSIS — I110 Hypertensive heart disease with heart failure: Secondary | ICD-10-CM | POA: Diagnosis not present

## 2023-09-15 DIAGNOSIS — K219 Gastro-esophageal reflux disease without esophagitis: Secondary | ICD-10-CM | POA: Diagnosis not present

## 2023-09-15 DIAGNOSIS — D509 Iron deficiency anemia, unspecified: Secondary | ICD-10-CM | POA: Diagnosis present

## 2023-09-15 DIAGNOSIS — G4730 Sleep apnea, unspecified: Secondary | ICD-10-CM | POA: Insufficient documentation

## 2023-09-15 DIAGNOSIS — I5032 Chronic diastolic (congestive) heart failure: Secondary | ICD-10-CM | POA: Insufficient documentation

## 2023-09-15 LAB — CBC WITH DIFFERENTIAL (CANCER CENTER ONLY)
Abs Immature Granulocytes: 0.01 K/uL (ref 0.00–0.07)
Basophils Absolute: 0 K/uL (ref 0.0–0.1)
Basophils Relative: 1 %
Eosinophils Absolute: 0.1 K/uL (ref 0.0–0.5)
Eosinophils Relative: 3 %
HCT: 36.1 % (ref 36.0–46.0)
Hemoglobin: 11.3 g/dL — ABNORMAL LOW (ref 12.0–15.0)
Immature Granulocytes: 0 %
Lymphocytes Relative: 25 %
Lymphs Abs: 1.3 K/uL (ref 0.7–4.0)
MCH: 26.8 pg (ref 26.0–34.0)
MCHC: 31.3 g/dL (ref 30.0–36.0)
MCV: 85.7 fL (ref 80.0–100.0)
Monocytes Absolute: 0.3 K/uL (ref 0.1–1.0)
Monocytes Relative: 5 %
Neutro Abs: 3.5 K/uL (ref 1.7–7.7)
Neutrophils Relative %: 66 %
Platelet Count: 423 K/uL — ABNORMAL HIGH (ref 150–400)
RBC: 4.21 MIL/uL (ref 3.87–5.11)
RDW: 15.4 % (ref 11.5–15.5)
WBC Count: 5.3 K/uL (ref 4.0–10.5)
nRBC: 0 % (ref 0.0–0.2)

## 2023-09-15 LAB — BASIC METABOLIC PANEL WITH GFR
Anion gap: 12 (ref 5–15)
BUN: 18 mg/dL (ref 8–23)
CO2: 26 mmol/L (ref 22–32)
Calcium: 9.6 mg/dL (ref 8.9–10.3)
Chloride: 103 mmol/L (ref 98–111)
Creatinine, Ser: 0.79 mg/dL (ref 0.44–1.00)
GFR, Estimated: >60 mL/min (ref >60)
Glucose, Bld: 117 mg/dL — ABNORMAL HIGH (ref 70–99)
Potassium: 3.5 mmol/L (ref 3.5–5.1)
Sodium: 141 mmol/L (ref 135–145)

## 2023-09-15 LAB — IRON AND TIBC
Iron: 37 ug/dL (ref 28–170)
Saturation Ratios: 8 % — ABNORMAL LOW (ref 10.4–31.8)
TIBC: 466 ug/dL — ABNORMAL HIGH (ref 250–450)
UIBC: 429 ug/dL

## 2023-09-15 LAB — FERRITIN: Ferritin: 4 ng/mL — ABNORMAL LOW (ref 11–307)

## 2023-09-15 MED ORDER — PANTOPRAZOLE SODIUM 40 MG PO TBEC: 40.0000 mg | DELAYED_RELEASE_TABLET | Freq: Two times a day (BID) | ORAL | 3 refills | Status: AC

## 2023-09-15 MED ORDER — IRON SUCROSE 20 MG/ML IV SOLN
200.0000 mg | Freq: Once | INTRAVENOUS | Status: AC
  Administered 2023-09-15: 200 mg via INTRAVENOUS
  Filled 2023-09-15: qty 10

## 2023-09-15 NOTE — Progress Notes (Signed)
 Pt in for follow up, reports has appt in [facility] for consult per Dr Jordis.

## 2023-09-15 NOTE — Assessment & Plan Note (Signed)
#   Severe Anemia- [Feb 2025- Hb 4.6] -Hb-symptomatic.  Likely due to iron  deficiency. Oral iron : started in FEB 2025 [? Abdominal cramps]- tolerating well.   # S/p IV iron  infusion/Venofer - proceed with venofer  today  #  Mild B12- deficiency [FEB 2025-218]- recommend OTC Sublingula B12.    #Etiology of iron  deficiency: S/p GI evaluation-  colonoscopy- on [facility] [Dr.Russow] on April 3rd, 2025-multiple duodenal/gastric erosions. Awaiting surgery [facility]- evaluation re: hernia.   # CHF- diastolic- Dr.Alluri [[facility]]; s/p  appt.   # DISPOSITION: # venofer  today # venofer  in 2 weeks # follow up 4 months-- MD; labs- cbc/bmp; iron  sudies; ferritin; b12 levels- possible venofer - Dr.B

## 2023-09-15 NOTE — Progress Notes (Signed)
 Sydney Collins CONSULT NOTE  Patient Care Team: [facility] as PCP - General Sydney Collins Sydney Collins as Consulting Physician (Oncology)  CHIEF COMPLAINTS/PURPOSE OF CONSULTATION: ANEMIA   HEMATOLOGY HISTORY  # ANEMIA[Hb; MCV-platelets- WBC; Iron  sat; ferritin;  GFR- CT/US - ;    Latest Reference Range & Units Most Recent  Iron  28 - 170 ug/dL 21 (L) 09/01/72 99:52  UIBC ug/dL 499 09/01/72 99:52  TIBC 250 - 450 ug/dL 478 (H) 09/01/72 99:52  Saturation Ratios 10.4 - 31.8 % 4 (L) 04/10/23 00:47  Ferritin 11 - 307 ng/mL 2 (L) 04/10/23 00:47  Folate >5.9 ng/mL 12.6 04/10/23 00:47  (L): Data is abnormally low (H): Data is abnormally high  HISTORY OF PRESENTING ILLNESS: /.Patient ambulating-independently. Alone.  Sydney Collins 63 y.o.  female pleasant patient with a hx of= sleep apnea, anemia, GERD, obesity, chronic right upper sided abdominal discomfort- diastolic CHF and iron  deficiency anemia unclear etiology is here for follow-up.  Pt in for follow up, reports has appt in [facility] for consult per Dr Sydney Collins. S/p venofer - Otherwise overall improved.   Review of Systems  Constitutional:  Positive for malaise/fatigue. Negative for chills, diaphoresis, fever and weight loss.  HENT:  Negative for nosebleeds and sore throat.   Eyes:  Negative for double vision.  Respiratory:  Positive for shortness of breath. Negative for cough, hemoptysis, sputum production and wheezing.   Cardiovascular:  Negative for chest pain, palpitations, orthopnea and leg swelling.  Gastrointestinal:  Negative for abdominal pain, blood in stool, constipation, diarrhea, heartburn, melena, nausea and vomiting.  Genitourinary:  Negative for dysuria, frequency and urgency.  Musculoskeletal:  Negative for back pain and joint pain.  Skin: Negative.  Negative for itching and rash.  Neurological:  Negative for dizziness, tingling, focal weakness, weakness and headaches.   Endo/Heme/Allergies:  Does not bruise/bleed easily.  Psychiatric/Behavioral:  Negative for depression. The patient is not nervous/anxious and does not have insomnia.      MEDICAL HISTORY:  Past Medical History:  Diagnosis Date   Anemia    H/O   Arthritis    Complication of anesthesia    could feel everything when she was being extubated   Diastolic heart failure (HCC)    GERD (gastroesophageal reflux disease)    H/O   History of chickenpox    Hypertension    H/O OVER A SHORT PERIOD OF TIME- LOST WEIGHT AND IS NO LONGER TAKING BP MED PER PCP   Pre-diabetes    Primary osteoarthritis of right shoulder    Sleep apnea 1999   WAS TOLD SHE STOPPED BREATHING 80 TIMES DURING THE NIGHT.  NEVER WAS GIVEN CPAP MACHINE PER PT    SURGICAL HISTORY: Past Surgical History:  Procedure Laterality Date   APPENDECTOMY     COLONOSCOPY WITH PROPOFOL  N/A 06/04/2023   Procedure: COLONOSCOPY WITH PROPOFOL ;  Surgeon: Onita Elspeth Sharper, DO;  Location: [facility] ENDOSCOPY;  Service: Gastroenterology;  Laterality: N/A;   ESOPHAGOGASTRODUODENOSCOPY (EGD) WITH PROPOFOL  N/A 06/04/2023   Procedure: ESOPHAGOGASTRODUODENOSCOPY (EGD) WITH PROPOFOL ;  Surgeon: Onita Elspeth Sharper, DO;  Location: [facility] END[facility]COPY;  Service: Gastroenterology;  Laterality: N/A;   SHOULDER ARTHROSCOPY Right 06/05/2015   Procedure: ARTHROSCOPY SHOULDER;  Surgeon: Norleen JINNY Maltos, Collins;  Location: [facility] ORS;  Service: Orthopedics;  Laterality: Right;   SHOULDER ARTHROSCOPY WITH DEBRIDEMENT AND BICEP TENDON REPAIR Right 05/13/2016   Procedure: SHOULDER ARTHROSCOPY WITH DEBRIDEMENT AND BICEP TENDON REPAIR;  Surgeon: Norleen JINNY Maltos, Collins;  Location: [facility]  ORS;  Service: Orthopedics;  Laterality: Right;   SHOULDER ARTHROSCOPY WITH OPEN ROTATOR CUFF REPAIR Right 05/13/2016   Procedure: SHOULDER ARTHROSCOPY WITH OPEN ROTATOR CUFF REPAIR;  Surgeon: Norleen JINNY Maltos, Collins;  Location: [facility] ORS;  Service: Orthopedics;  Laterality: Right;   SHOULDER ARTHROSCOPY WITH OPEN  ROTATOR CUFF REPAIR Right 03/17/2017   Procedure: SHOULDER ARTHROSCOPY WITH OPEN ROTATOR CUFF REPAIR;  Surgeon: Maltos Norleen JINNY, Collins;  Location: [facility] ORS;  Service: Orthopedics;  Laterality: Right;  arthroscopic debridement    SINUS IRRIGATION      SOCIAL HISTORY: Social History   Socioeconomic History   Marital status: Legally Separated    Spouse name: Not on file   Number of children: Not on file   Years of education: Not on file   Highest education level: Not on file  Occupational History   Not on file  Tobacco Use   Smoking status: Never    Passive exposure: Never   Smokeless tobacco: Never  Vaping Use   Vaping status: Never Used  Substance and Sexual Activity   Alcohol use: No   Drug use: No   Sexual activity: Yes  Other Topics Concern   Not on file  Social History Narrative   Not on file   Social Drivers of Health   Financial Resource Strain: Not on file  Food Insecurity: Food Insecurity Present (04/17/2023)   Hunger Vital Sign    Worried About Running Out of Food in the Last Year: Never true    Ran Out of Food in the Last Year: Sometimes true  Transportation Needs: Unmet Transportation Needs (04/17/2023)   PRAPARE - Administrator, Civil Service (Medical): Yes    Lack of Transportation (Non-Medical): Yes  Physical Activity: Not on file  Stress: Not on file  Social Connections: Not on file  Intimate Partner Violence: Not At Risk (04/17/2023)   Humiliation, Afraid, Rape, and Kick questionnaire    Fear of Current or Ex-Partner: No    Emotionally Abused: No    Physically Abused: No    Sexually Abused: No    FAMILY HISTORY: Family History  Problem Relation Age of Onset   Multiple myeloma Mother    Heart attack Father     ALLERGIES:  is allergic to latex.  MEDICATIONS:  Current Outpatient Medications  Medication Sig Dispense Refill   acetaminophen  (TYLENOL ) 500 MG tablet Take 1 tablet (500 mg total) by mouth every 6 (six) hours as needed for  moderate pain (pain score 4-6) or headache. 30 tablet 0   albuterol  (VENTOLIN  HFA) 108 (90 Base) MCG/ACT inhaler Inhale 2 puffs into the lungs every 4 (four) hours as needed. 8 g 0   ferrous sulfate  325 (65 FE) MG tablet Take 1 tablet (325 mg total) by mouth 2 (two) times daily with a meal. 60 tablet 3   furosemide  (LASIX ) 20 MG tablet Take 1 tablet (20 mg total) by mouth 2 (two) times daily. 60 tablet 0   hydrOXYzine (ATARAX) 25 MG tablet Take 25 mg by mouth daily.     potassium chloride  SA (KLOR-CON  M) 20 MEQ tablet Take 1 tablet (20 mEq total) by mouth 2 (two) times daily. 60 tablet 0   sertraline (ZOLOFT) 50 MG tablet Take 50 mg by mouth daily.     pantoprazole  (PROTONIX ) 40 MG tablet Take 1 tablet (40 mg total) by mouth 2 (two) times daily. 180 tablet 3   No current facility-administered medications for this visit.   Facility-Administered Medications Ordered  in Other Visits  Medication Dose Route Frequency Provider Last Rate Last Admin   iron  sucrose (VENOFER ) injection 200 mg  200 mg Intravenous Once Meryem Haertel R, Collins         PHYSICAL EXAMINATION:   Vitals:   09/15/23 1033  BP: 120/84  Pulse: 87  Resp: 16  Temp: (!) 96.1 F (35.6 C)  SpO2: 97%   Filed Weights   09/15/23 1033  Weight: 240 lb (108.9 kg)    Physical Exam Vitals and nursing note reviewed.  HENT:     Head: Normocephalic and atraumatic.     Mouth/Throat:     Pharynx: Oropharynx is clear.  Eyes:     Extraocular Movements: Extraocular movements intact.     Pupils: Pupils are equal, round, and reactive to light.  Cardiovascular:     Rate and Rhythm: Normal rate and regular rhythm.  Pulmonary:     Comments: Decreased breath sounds bilaterally.  Abdominal:     Palpations: Abdomen is soft.  Musculoskeletal:        General: Normal range of motion.     Cervical back: Normal range of motion.  Skin:    General: Skin is warm.  Neurological:     General: No focal deficit present.     Mental Status:  She is alert and oriented to person, place, and time.  Psychiatric:        Behavior: Behavior normal.        Judgment: Judgment normal.      LABORATORY DATA:  I have reviewed the data as listed Lab Results  Component Value Date   WBC 5.3 09/15/2023   HGB 11.3 (L) 09/15/2023   HCT 36.1 09/15/2023   MCV 85.7 09/15/2023   PLT 423 (H) 09/15/2023   Recent Labs    04/09/23 1951 04/10/23 0515 07/15/23 1514 08/17/23 0912 09/15/23 1012  NA  --    < > 138 139 141  K  --    < > 4.0 3.1* 3.5  CL  --    < > 110 105 103  CO2  --    < > 21* 24 26  GLUCOSE  --    < > 91 156* 117*  BUN  --    < > 17 16 18   CREATININE  --    < > 0.78 0.74 0.79  CALCIUM  --    < > 8.8* 9.1 9.6  GFRNONAA  --    < > >60 >60 >60  PROT 7.1  --  7.0  --   --   ALBUMIN 3.8  --  3.7  --   --   AST 24  --  18  --   --   ALT 23  --  14  --   --   ALKPHOS 56  --  57  --   --   BILITOT 0.6  --  0.4  --   --   BILIDIR <0.1  --   --   --   --   IBILI NOT CALCULATED  --   --   --   --    < > = values in this interval not displayed.     US  Venous Img Lower Bilateral Result Date: 08/17/2023 CLINICAL DATA:  Shortness of breath and leg swelling for 1 week. EXAM: Bilateral Lower Extremity Venous Doppler Ultrasound TECHNIQUE: Gray-scale sonography with compression, as well as color and duplex ultrasound, were performed to evaluate the deep venous system(s) from  the level of the common femoral vein through the popliteal and proximal calf veins. COMPARISON:  None available FINDINGS: VENOUS Normal compressibility of the common femoral, superficial femoral, and popliteal veins, as well as the visualized calf veins. Visualized portions of profunda femoral vein and great saphenous vein unremarkable. No filling defects to suggest DVT on grayscale or color Doppler imaging. Doppler waveforms show normal direction of venous flow, normal respiratory plasticity and response to augmentation. OTHER Targeted sonographic evaluation of the  palpable region in the RIGHT anterior thigh demonstrates an oval isoechoic circumscribed mass measuring 2.6 x 1.3 x 2.6 cm. Limitations: none IMPRESSION: 1. No lower extremity DVT. 2. Well-circumscribed, isoechoic, ovoid mass measuring 2.6 x 1.3 x 2.6 cm corresponds to the palpable abnormality in the RIGHT anterior thigh. 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 If confirmation is required, contrast-enhanced MRI may be obtained. If there is clinical evidence of growth over time, repeat imaging should be performed. Electronically Signed   By: Aliene Lloyd M.D.   On: 08/17/2023 10:45   DG Chest 2 View Result Date: 08/17/2023 CLINICAL DATA:  Shortness of breath EXAM: CHEST - 2 VIEW COMPARISON:  None Available. FINDINGS: The heart size and mediastinal contours are within normal limits. Both lungs are clear. The visualized skeletal structures are unremarkable. IMPRESSION: No active cardiopulmonary disease. Large hiatal hernia with a large air-fluid level Electronically Signed   By: Franky Chard M.D.   On: 08/17/2023 09:23    ASSESSMENT & PLAN:   Symptomatic anemia # Severe Anemia- [Feb 2025- Hb 4.6] -Hb-symptomatic.  Likely due to iron  deficiency. Oral iron : started in FEB 2025 [? Abdominal cramps]- tolerating well.   # S/p IV iron  infusion/Venofer - proceed with venofer  today  #  Mild B12- deficiency [FEB 2025-218]- recommend OTC Sublingula B12.    #Etiology of iron  deficiency: S/p GI evaluation-  colonoscopy- on [facility] [Dr.Russow] on April 3rd, 2025-multiple duodenal/gastric erosions. Awaiting surgery [facility]- evaluation re: hernia.   # CHF- diastolic- Dr.Alluri [[facility]]; s/p  appt.   # DISPOSITION: # venofer  today # venofer  in 2 weeks # follow up 4 months-- Collins; labs- cbc/bmp; iron  sudies; ferritin; b12 levels- possible venofer - Dr.B     All questions were answered. The patient knows to call the clinic with any problems, questions or concerns.    Collins JONELLE Joe,  Collins 09/15/2023 11:17 AM

## 2023-09-30 ENCOUNTER — Inpatient Hospital Stay

## 2023-09-30 VITALS — BP 132/90 | HR 91 | Temp 97.3°F | Resp 17

## 2023-09-30 DIAGNOSIS — D509 Iron deficiency anemia, unspecified: Secondary | ICD-10-CM | POA: Diagnosis not present

## 2023-09-30 DIAGNOSIS — D649 Anemia, unspecified: Secondary | ICD-10-CM

## 2023-09-30 MED ORDER — IRON SUCROSE 20 MG/ML IV SOLN
200.0000 mg | Freq: Once | INTRAVENOUS | Status: AC
  Administered 2023-09-30: 200 mg via INTRAVENOUS
  Filled 2023-09-30: qty 10

## 2023-09-30 MED ORDER — SODIUM CHLORIDE 0.9% FLUSH
10.0000 mL | Freq: Once | INTRAVENOUS | Status: AC | PRN
Start: 2023-09-30 — End: 2023-09-30
  Administered 2023-09-30: 10 mL
  Filled 2023-09-30: qty 10

## 2023-10-06 ENCOUNTER — Encounter

## 2023-10-06 ENCOUNTER — Ambulatory Visit: Admitting: Pulmonary Disease

## 2023-10-13 ENCOUNTER — Other Ambulatory Visit: Payer: Self-pay | Admitting: Medical Genetics

## 2023-11-25 ENCOUNTER — Ambulatory Visit

## 2023-12-17 ENCOUNTER — Ambulatory Visit

## 2023-12-17 ENCOUNTER — Encounter: Payer: Self-pay | Admitting: Pulmonary Disease

## 2023-12-17 ENCOUNTER — Ambulatory Visit: Admitting: Pulmonary Disease

## 2023-12-17 VITALS — BP 116/76 | HR 83 | Temp 98.0°F | Ht 63.0 in | Wt 220.6 lb

## 2023-12-17 DIAGNOSIS — K449 Diaphragmatic hernia without obstruction or gangrene: Secondary | ICD-10-CM

## 2023-12-17 DIAGNOSIS — R0602 Shortness of breath: Secondary | ICD-10-CM

## 2023-12-17 DIAGNOSIS — D500 Iron deficiency anemia secondary to blood loss (chronic): Secondary | ICD-10-CM

## 2023-12-17 DIAGNOSIS — E66812 Obesity, class 2: Secondary | ICD-10-CM

## 2023-12-17 DIAGNOSIS — Z6838 Body mass index (BMI) 38.0-38.9, adult: Secondary | ICD-10-CM

## 2023-12-17 DIAGNOSIS — E669 Obesity, unspecified: Secondary | ICD-10-CM

## 2023-12-17 DIAGNOSIS — I509 Heart failure, unspecified: Secondary | ICD-10-CM | POA: Diagnosis not present

## 2023-12-17 LAB — PULMONARY FUNCTION TEST
DL/VA % pred: 90 %
DL/VA: 3.85 ml/min/mmHg/L
DLCO unc % pred: 85 %
DLCO unc: 16.45 ml/min/mmHg
FEF 25-75 Post: 2.21 L/s
FEF 25-75 Pre: 1.82 L/s
FEF2575-%Change-Post: 20 %
FEF2575-%Pred-Post: 100 %
FEF2575-%Pred-Pre: 82 %
FEV1-%Change-Post: 5 %
FEV1-%Pred-Post: 92 %
FEV1-%Pred-Pre: 87 %
FEV1-Post: 2.22 L
FEV1-Pre: 2.11 L
FEV1FVC-%Change-Post: 2 %
FEV1FVC-%Pred-Pre: 100 %
FEV6-%Change-Post: 3 %
FEV6-%Pred-Post: 93 %
FEV6-%Pred-Pre: 90 %
FEV6-Post: 2.8 L
FEV6-Pre: 2.72 L
FEV6FVC-%Pred-Post: 103 %
FEV6FVC-%Pred-Pre: 103 %
FVC-%Change-Post: 3 %
FVC-%Pred-Post: 89 %
FVC-%Pred-Pre: 86 %
FVC-Post: 2.8 L
FVC-Pre: 2.72 L
Post FEV1/FVC ratio: 79 %
Post FEV6/FVC ratio: 100 %
Pre FEV1/FVC ratio: 78 %
Pre FEV6/FVC Ratio: 100 %
RV % pred: 125 %
RV: 2.48 L
TLC % pred: 106 %
TLC: 5.22 L

## 2023-12-17 NOTE — Progress Notes (Signed)
 Subjective:    Patient ID: Sydney Collins, female    DOB: Jul 20, 1960, 63 y.o.   MRN: [id number]  Patient Care Team: [facility] as PCP - General Rennie, Cindy SAUNDERS, MD as Consulting Physician (Oncology)  Chief Complaint  Patient presents with   Shortness of Breath    Occasional shortness of breath.     BACKGROUND/INTERVAL:Patient is a 63 year old lifelong never smoker who presents for follow-up of  shortness of breath on exertion.  She was initially evaluated here on 18 June 2023.  Shortness of breath was believed to have been multifactorial particularly due to severe anemia, large hiatal hernia and obesity with deconditioning.  She was last seen on 19 August 2023.  At that time she was doing better after correction of her severe anemia.  HPI Discussed the use of AI scribe software for clinical note transcription with the patient, who gave verbal consent to proceed.  History of Present Illness   Sydney Collins is a 63 year old female with heart failure and anemia who presents follow-up on the issue of shortness of breath.  She experiences shortness of breath, which has improved recently, possibly due to a 20-pound weight loss. Her previous symptoms were partly attributed to profound anemia, which has been addressed with iron  infusions. She is scheduled for another infusion on November 17.  She recently visited a heart specialist who advised her to take diuretics as needed, particularly if she experiences fluid retention or shortness of breath. She does not have a follow-up until July.  Her heart failure was attributed to high output failure due to anemia.  She uses albuterol  as needed but has not required this since her anemia was corrected.  She is scheduled for a sleep study, which is a prerequisite for potential hernia repair and gastric bypass surgery. No symptoms of sleep apnea, such as falling asleep during conversations or while driving.   She had PFTs  today that were essentially normal perhaps some very minimal airway obstruction.  FEV1 was 2.11 L or 87% predicted, FVC 2.72 L or 86% predicted, FEV1/FVC 78%, lung volumes normal, ERV low consistent with obesity.  Diffusion capacity normal.     Review of Systems A 10 point review of systems was performed and it is as noted above otherwise negative.   Patient Active Problem List   Diagnosis Date Noted   GERD without esophagitis 04/10/2023   Acute CHF (HCC) 04/10/2023   Iron  deficiency anemia 04/10/2023   Symptomatic anemia 04/09/2023    Social History   Tobacco Use   Smoking status: Never    Passive exposure: Never   Smokeless tobacco: Never  Substance Use Topics   Alcohol use: No    Allergies  Allergen Reactions   Latex Other (See Comments)    blisters    Current Meds  Medication Sig   acetaminophen  (TYLENOL ) 500 MG tablet Take 1 tablet (500 mg total) by mouth every 6 (six) hours as needed for moderate pain (pain score 4-6) or headache.   albuterol  (VENTOLIN  HFA) 108 (90 Base) MCG/ACT inhaler Inhale 2 puffs into the lungs every 4 (four) hours as needed.   ferrous sulfate  325 (65 FE) MG tablet Take 1 tablet (325 mg total) by mouth 2 (two) times daily with a meal.   furosemide  (LASIX ) 20 MG tablet Take 1 tablet (20 mg total) by mouth 2 (two) times daily.   hydrOXYzine (ATARAX) 25 MG tablet Take 25 mg by mouth daily.   pantoprazole  (  PROTONIX ) 40 MG tablet Take 1 tablet (40 mg total) by mouth 2 (two) times daily.   potassium chloride  SA (KLOR-CON  M) 20 MEQ tablet Take 1 tablet (20 mEq total) by mouth 2 (two) times daily.   sertraline (ZOLOFT) 50 MG tablet Take 50 mg by mouth daily.    Immunization History  Administered Date(s) Administered    sv, Bivalent, Protein Subunit Rsvpref,pf Marlow) 03/12/2022   Influenza,inj,Quad PF,6+ Mos 03/12/2022        Objective:     BP 116/76   Pulse 83   Temp 98 F (36.7 C) (Temporal)   Ht 5' 3 (1.6 m)   Wt 220 lb 9.6 oz (100.1  kg)   SpO2 99%   BMI 39.08 kg/m   SpO2: 99 %  GENERAL: Obese woman, no acute distress, fully ambulatory, no conversational dyspnea. HEAD: Normocephalic, atraumatic.  EYES: Pupils equal, round, reactive to light.  No scleral icterus.  MOUTH: Partial upper dentures, oral mucosa moist.  No thrush. NECK: Supple. No thyromegaly. Trachea midline. No JVD.  No adenopathy. PULMONARY: Good air entry bilaterally.  No adventitious sounds. CARDIOVASCULAR: S1 and S2. Regular rate and rhythm.  No rubs, murmurs or gallops heard. ABDOMEN: Obese, otherwise benign. MUSCULOSKELETAL: No joint deformity, no clubbing, no edema.  NEUROLOGIC: No overt focal deficit, no gait disturbance, speech is fluent. SKIN: Intact,warm,dry.  Very tanned. PSYCH: Mood and behavior normal.    Assessment & Plan:     ICD-10-CM   1. Shortness of breath  R06.02     2. Hiatal hernia with gastroesophageal reflux disease and esophagitis  K44.9    K21.00     3. Iron  deficiency anemia due to chronic blood loss  D50.0     4. Obesity, Class II, BMI 35-39.9, isolated (see actual BMI)  Z33.187      Discussion:    Shortness of breath, multifactorial Shortness of breath has improved, likely due to weight loss and resolution of anemia. Lung function tests show no significant improvement post-albuterol , indicating well-managed lung function. No evidence of heart failure exacerbation.  Anemia, status post iron  infusion Anemia has been addressed with iron  infusions. Next infusion scheduled for November 17th. - Continue with scheduled iron  infusion on November 17th.  Obesity Weight loss of 20 pounds has contributed to improvement in shortness of breath. Continued weight management is beneficial for respiratory function. - Encouraged continued weight management efforts.  Heart failure, this was likely due to high-output failure from anemia Diuretics are to be taken as needed based on fluid status and symptoms. - Continue  diuretics as needed for fluid retention or shortness of breath.     Will see the patient on an as-needed basis.  Advised if symptoms do not improve or worsen, to please contact office for sooner follow up or seek emergency care.    I spent 20 minutes of dedicated to the care of this patient on the date of this encounter to include pre-visit review of records, face-to-face time with the patient discussing conditions above, post visit ordering of testing, clinical documentation with the electronic health record, making appropriate referrals as documented, and communicating necessary findings to members of the patients care team.     C. Leita Sanders, MD Advanced Bronchoscopy [facility]    *This note was generated using voice recognition software/Dragon and/or AI transcription program.  Despite best efforts to proofread, errors can occur which can change the meaning. Any transcriptional errors that result from this process are unintentional and may not  be fully corrected at the time of dictation.

## 2023-12-17 NOTE — Progress Notes (Signed)
 Full PFT completed today ? ?

## 2023-12-17 NOTE — Patient Instructions (Addendum)
 VISIT SUMMARY:  Sydney Collins, you visited us  today due to concerns about shortness of breath. Your symptoms have improved recently, likely due to your weight loss and the treatment of your anemia. We reviewed your current treatments and discussed your ongoing care plan.  YOUR PLAN:  -SHORTNESS OF BREATH: Your shortness of breath has improved, likely due to your recent weight loss and the treatment of your anemia. Your lung function tests show that your lung function is well-managed, and there is no evidence of worsening heart failure. Continue to monitor your symptoms and use your albuterol  as needed.  -ANEMIA: Anemia is a condition where you don't have enough healthy red blood cells to carry adequate oxygen to your body's tissues. Your anemia has been treated with iron  infusions, and your next infusion is scheduled for November 17th. Please continue with this treatment as planned.  -OBESITY: Obesity means having an excessive amount of body fat. Your recent weight loss of 20 pounds has helped improve your shortness of breath. We encourage you to continue your weight management efforts as this will benefit your overall health and respiratory function.  -HEART FAILURE: Heart failure means that your heart is not pumping blood as well as it should.  This likely occurred when you were severely anemic as it can make the heart less efficient.  Your condition has improved and is stable, and you should continue taking diuretics as needed to manage fluid retention or shortness of breath. Please monitor your symptoms and take the medication as directed.  Continue following with cardiology as directed.  INSTRUCTIONS:  Please continue with your scheduled iron  infusion on November 17th. If you experience any worsening of symptoms or have concerns, contact your healthcare provider. Your next follow-up appointment with the heart specialist is in July.

## 2023-12-17 NOTE — Patient Instructions (Signed)
 Full PFT completed today ? ?

## 2023-12-24 ENCOUNTER — Inpatient Hospital Stay: Admission: RE | Admit: 2023-12-24 | Source: Ambulatory Visit

## 2023-12-25 ENCOUNTER — Other Ambulatory Visit: Payer: Self-pay | Admitting: Medical Genetics

## 2023-12-25 DIAGNOSIS — Z006 Encounter for examination for normal comparison and control in clinical research program: Secondary | ICD-10-CM

## 2024-01-18 ENCOUNTER — Encounter: Payer: Self-pay | Admitting: Internal Medicine

## 2024-01-18 ENCOUNTER — Inpatient Hospital Stay: Admitting: Internal Medicine

## 2024-01-18 ENCOUNTER — Inpatient Hospital Stay: Attending: Internal Medicine

## 2024-01-18 ENCOUNTER — Inpatient Hospital Stay

## 2024-01-18 VITALS — BP 144/65 | HR 61 | Temp 97.3°F | Resp 18

## 2024-01-18 VITALS — BP 125/77 | HR 67 | Temp 98.5°F | Resp 16 | Ht 63.0 in | Wt 215.9 lb

## 2024-01-18 DIAGNOSIS — K259 Gastric ulcer, unspecified as acute or chronic, without hemorrhage or perforation: Secondary | ICD-10-CM | POA: Diagnosis not present

## 2024-01-18 DIAGNOSIS — I110 Hypertensive heart disease with heart failure: Secondary | ICD-10-CM | POA: Insufficient documentation

## 2024-01-18 DIAGNOSIS — D509 Iron deficiency anemia, unspecified: Secondary | ICD-10-CM | POA: Diagnosis present

## 2024-01-18 DIAGNOSIS — D649 Anemia, unspecified: Secondary | ICD-10-CM

## 2024-01-18 DIAGNOSIS — I5032 Chronic diastolic (congestive) heart failure: Secondary | ICD-10-CM | POA: Diagnosis not present

## 2024-01-18 LAB — CBC WITH DIFFERENTIAL (CANCER CENTER ONLY)
Abs Immature Granulocytes: 0.02 K/uL (ref 0.00–0.07)
Basophils Absolute: 0.1 K/uL (ref 0.0–0.1)
Basophils Relative: 1 %
Eosinophils Absolute: 0.1 K/uL (ref 0.0–0.5)
Eosinophils Relative: 2 %
HCT: 37.6 % (ref 36.0–46.0)
Hemoglobin: 12.4 g/dL (ref 12.0–15.0)
Immature Granulocytes: 0 %
Lymphocytes Relative: 29 %
Lymphs Abs: 2 K/uL (ref 0.7–4.0)
MCH: 30.3 pg (ref 26.0–34.0)
MCHC: 33 g/dL (ref 30.0–36.0)
MCV: 91.9 fL (ref 80.0–100.0)
Monocytes Absolute: 0.4 K/uL (ref 0.1–1.0)
Monocytes Relative: 6 %
Neutro Abs: 4.3 K/uL (ref 1.7–7.7)
Neutrophils Relative %: 62 %
Platelet Count: 411 K/uL — ABNORMAL HIGH (ref 150–400)
RBC: 4.09 MIL/uL (ref 3.87–5.11)
RDW: 14.3 % (ref 11.5–15.5)
WBC Count: 7 K/uL (ref 4.0–10.5)
nRBC: 0 % (ref 0.0–0.2)

## 2024-01-18 LAB — BASIC METABOLIC PANEL - CANCER CENTER ONLY
Anion gap: 11 (ref 5–15)
BUN: 21 mg/dL (ref 8–23)
CO2: 23 mmol/L (ref 22–32)
Calcium: 9.4 mg/dL (ref 8.9–10.3)
Chloride: 100 mmol/L (ref 98–111)
Creatinine: 0.75 mg/dL (ref 0.44–1.00)
GFR, Estimated: >60 mL/min (ref >60)
Glucose, Bld: 84 mg/dL (ref 70–99)
Potassium: 3.5 mmol/L (ref 3.5–5.1)
Sodium: 134 mmol/L — ABNORMAL LOW (ref 135–145)

## 2024-01-18 LAB — FERRITIN: Ferritin: 15 ng/mL (ref 11–307)

## 2024-01-18 LAB — IRON AND TIBC
Iron: 41 ug/dL (ref 28–170)
Saturation Ratios: 10 % — ABNORMAL LOW (ref 10.4–31.8)
TIBC: 421 ug/dL (ref 250–450)
UIBC: 381 ug/dL

## 2024-01-18 LAB — VITAMIN B12: Vitamin B-12: 292 pg/mL (ref 180–914)

## 2024-01-18 MED ORDER — IRON SUCROSE 20 MG/ML IV SOLN
200.0000 mg | Freq: Once | INTRAVENOUS | Status: AC
  Administered 2024-01-18: 200 mg via INTRAVENOUS
  Filled 2024-01-18: qty 10

## 2024-01-18 MED ORDER — SODIUM CHLORIDE 0.9% FLUSH
10.0000 mL | Freq: Once | INTRAVENOUS | Status: AC | PRN
  Administered 2024-01-18: 10 mL
  Filled 2024-01-18: qty 10

## 2024-01-18 NOTE — Progress Notes (Signed)
 Sydney Collins CONSULT NOTE  Patient Care Team: [facility] as PCP - General Rennie, Cindy SAUNDERS, MD as Consulting Physician (Oncology)  CHIEF COMPLAINTS/PURPOSE OF CONSULTATION: ANEMIA   HEMATOLOGY HISTORY  # ANEMIA[Hb; MCV-platelets- WBC; Iron  sat; ferritin;  GFR- CT/US - ;    Latest Reference Range & Units Most Recent  Iron  28 - 170 ug/dL 21 (L) 09/01/72 99:52  UIBC ug/dL 499 09/01/72 99:52  TIBC 250 - 450 ug/dL 478 (H) 09/01/72 99:52  Saturation Ratios 10.4 - 31.8 % 4 (L) 04/10/23 00:47  Ferritin 11 - 307 ng/mL 2 (L) 04/10/23 00:47  Folate >5.9 ng/mL 12.6 04/10/23 00:47  (L): Data is abnormally low (H): Data is abnormally high  HISTORY OF PRESENTING ILLNESS: /.Patient ambulating-independently. With her cousin.   Sydney Collins 63 y.o.  female pleasant patient with a hx of sleep apnea, anemia, GERD, obesity, chronic right upper sided abdominal discomfort- diastolic CHF and iron  deficiency anemia unclear etiology is here for follow-up.  Discussed the use of AI scribe software for clinical note transcription with the patient, who gave verbal consent to proceed.  History of Present Illness   Sydney Collins is a 63 year old female with anemia who presents for follow-up. She is accompanied by her cousin.  She has been receiving iron  infusions and is also taking oral iron  supplements, although she dislikes the taste. Her hemoglobin levels have improved significantly from a low of 4 g/dL to 87.5 g/dL.  She has a history of planned multi-surgery for her stomach, including hernia and weight loss surgery, which was postponed due to a positive cannabis test. She smokes cannabis, which she purchases legally, to help her relax at night. She is not currently on the [facility] shot for weight loss due to insurance issues but continues to eat healthily and has noticed a reduced craving for salt and sugar.  She has a family history of hernias, as both she and her mother  have similar conditions. She has not had a menstrual cycle since 2015.  She reports snoring but has not been diagnosed with sleep apnea. She receives her medical supplies from the [place].       Review of Systems  Constitutional:  Positive for malaise/fatigue. Negative for chills, diaphoresis, fever and weight loss.  HENT:  Negative for nosebleeds and sore throat.   Eyes:  Negative for double vision.  Respiratory:  Positive for shortness of breath. Negative for cough, hemoptysis, sputum production and wheezing.   Cardiovascular:  Negative for chest pain, palpitations, orthopnea and leg swelling.  Gastrointestinal:  Negative for abdominal pain, blood in stool, constipation, diarrhea, heartburn, melena, nausea and vomiting.  Genitourinary:  Negative for dysuria, frequency and urgency.  Musculoskeletal:  Negative for back pain and joint pain.  Skin: Negative.  Negative for itching and rash.  Neurological:  Negative for dizziness, tingling, focal weakness, weakness and headaches.  Endo/Heme/Allergies:  Does not bruise/bleed easily.  Psychiatric/Behavioral:  Negative for depression. The patient is not nervous/anxious and does not have insomnia.      MEDICAL HISTORY:  Past Medical History:  Diagnosis Date   Anemia    H/O   Arthritis    Complication of anesthesia    could feel everything when she was being extubated   Diastolic heart failure (HCC)    GERD (gastroesophageal reflux disease)    H/O   History of chickenpox    Hypertension    H/O OVER A SHORT PERIOD OF TIME- LOST WEIGHT AND IS  NO LONGER TAKING BP MED PER PCP   Pre-diabetes    Primary osteoarthritis of right shoulder    Sleep apnea 1999   WAS TOLD SHE STOPPED BREATHING 80 TIMES DURING THE NIGHT.  NEVER WAS GIVEN CPAP MACHINE PER PT    SURGICAL HISTORY: Past Surgical History:  Procedure Laterality Date   APPENDECTOMY     COLONOSCOPY WITH PROPOFOL  N/A 06/04/2023   Procedure: COLONOSCOPY WITH PROPOFOL ;  Surgeon: Sydney Elspeth Sharper, DO;  Location: [facility] ENDOSCOPY;  Service: Gastroenterology;  Laterality: N/A;   ESOPHAGOGASTRODUODENOSCOPY (EGD) WITH PROPOFOL  N/A 06/04/2023   Procedure: ESOPHAGOGASTRODUODENOSCOPY (EGD) WITH PROPOFOL ;  Surgeon: Sydney Elspeth Sharper, DO;  Location: [facility] ENDOSCOPY;  Service: Gastroenterology;  Laterality: N/A;   SHOULDER ARTHROSCOPY Right 06/05/2015   Procedure: ARTHROSCOPY SHOULDER;  Surgeon: Sydney Collins Maltos, MD;  Location: [facility] ORS;  Service: Orthopedics;  Laterality: Right;   SHOULDER ARTHROSCOPY WITH DEBRIDEMENT AND BICEP TENDON REPAIR Right 05/13/2016   Procedure: SHOULDER ARTHROSCOPY WITH DEBRIDEMENT AND BICEP TENDON REPAIR;  Surgeon: Sydney Collins Maltos, MD;  Location: [facility] ORS;  Service: Orthopedics;  Laterality: Right;   SHOULDER ARTHROSCOPY WITH OPEN ROTATOR CUFF REPAIR Right 05/13/2016   Procedure: SHOULDER ARTHROSCOPY WITH OPEN ROTATOR CUFF REPAIR;  Surgeon: Sydney Collins Maltos, MD;  Location: [facility] ORS;  Service: Orthopedics;  Laterality: Right;   SHOULDER ARTHROSCOPY WITH OPEN ROTATOR CUFF REPAIR Right 03/17/2017   Procedure: SHOULDER ARTHROSCOPY WITH OPEN ROTATOR CUFF REPAIR;  Surgeon: Collins Sydney JINNY, MD;  Location: [facility] ORS;  Service: Orthopedics;  Laterality: Right;  arthroscopic debridement    SINUS IRRIGATION      SOCIAL HISTORY: Social History   Socioeconomic History   Marital status: Legally Separated    Spouse name: Not on file   Number of children: Not on file   Years of education: Not on file   Highest education level: Not on file  Occupational History   Not on file  Tobacco Use   Smoking status: Never    Passive exposure: Never   Smokeless tobacco: Never  Vaping Use   Vaping status: Never Used  Substance and Sexual Activity   Alcohol use: No   Drug use: No   Sexual activity: Yes  Other Topics Concern   Not on file  Social History Narrative   Not on file   Social Drivers of Health   Financial Resource Strain: Not on file  Food Insecurity: Food Insecurity Present  (04/17/2023)   Hunger Vital Sign    Worried About Running Out of Food in the Last Year: Never true    Ran Out of Food in the Last Year: Sometimes true  Transportation Needs: Unmet Transportation Needs (04/17/2023)   PRAPARE - Administrator, Civil Service (Medical): Yes    Lack of Transportation (Non-Medical): Yes  Physical Activity: Not on file  Stress: Not on file  Social Connections: Not on file  Intimate Partner Violence: Not At Risk (04/17/2023)   Humiliation, Afraid, Rape, and Kick questionnaire    Fear of Current or Ex-Partner: No    Emotionally Abused: No    Physically Abused: No    Sexually Abused: No    FAMILY HISTORY: Family History  Problem Relation Age of Onset   Multiple myeloma Mother    Heart attack Father     ALLERGIES:  is allergic to latex.  MEDICATIONS:  Current Outpatient Medications  Medication Sig Dispense Refill   acetaminophen  (TYLENOL ) 500 MG tablet Take 1 tablet (500 mg total) by mouth every 6 (  six) hours as needed for moderate pain (pain score 4-6) or headache. 30 tablet 0   albuterol  (VENTOLIN  HFA) 108 (90 Base) MCG/ACT inhaler Inhale 2 puffs into the lungs every 4 (four) hours as needed. 8 g 0   ferrous sulfate  325 (65 FE) MG tablet Take 1 tablet (325 mg total) by mouth 2 (two) times daily with a meal. 60 tablet 3   furosemide  (LASIX ) 20 MG tablet Take 1 tablet (20 mg total) by mouth 2 (two) times daily. (Patient taking differently: Take 20 mg by mouth 2 (two) times daily as needed.) 60 tablet 0   hydrOXYzine (ATARAX) 25 MG tablet Take 25 mg by mouth daily.     pantoprazole  (PROTONIX ) 40 MG tablet Take 1 tablet (40 mg total) by mouth 2 (two) times daily. 180 tablet 3   potassium chloride  SA (KLOR-CON  M) 20 MEQ tablet Take 1 tablet (20 mEq total) by mouth 2 (two) times daily. 60 tablet 0   sertraline (ZOLOFT) 50 MG tablet Take 50 mg by mouth daily.     No current facility-administered medications for this visit.   Facility-Administered  Medications Ordered in Other Visits  Medication Dose Route Frequency Provider Last Rate Last Admin   sodium chloride  flush (NS) 0.9 % injection 10 mL  10 mL Intracatheter Once PRN Katherine Syme R, MD         PHYSICAL EXAMINATION:   Vitals:   01/18/24 1424  BP: 125/77  Pulse: 67  Resp: 16  Temp: 98.5 F (36.9 C)  SpO2: 97%   Filed Weights   01/18/24 1424  Weight: 215 lb 14.4 oz (97.9 kg)    Physical Exam Vitals and nursing note reviewed.  HENT:     Head: Normocephalic and atraumatic.     Mouth/Throat:     Pharynx: Oropharynx is clear.  Eyes:     Extraocular Movements: Extraocular movements intact.     Pupils: Pupils are equal, round, and reactive to light.  Cardiovascular:     Rate and Rhythm: Normal rate and regular rhythm.  Pulmonary:     Comments: Decreased breath sounds bilaterally.  Abdominal:     Palpations: Abdomen is soft.  Musculoskeletal:        General: Normal range of motion.     Cervical back: Normal range of motion.  Skin:    General: Skin is warm.  Neurological:     General: No focal deficit present.     Mental Status: She is alert and oriented to person, place, and time.  Psychiatric:        Behavior: Behavior normal.        Judgment: Judgment normal.      LABORATORY DATA:  I have reviewed the data as listed Lab Results  Component Value Date   WBC 7.0 01/18/2024   HGB 12.4 01/18/2024   HCT 37.6 01/18/2024   MCV 91.9 01/18/2024   PLT 411 (H) 01/18/2024   Recent Labs    04/09/23 1951 04/10/23 0515 07/15/23 1514 08/17/23 0912 09/15/23 1012 01/18/24 1427  NA  --    < > 138 139 141 134*  K  --    < > 4.0 3.1* 3.5 3.5  CL  --    < > 110 105 103 100  CO2  --    < > 21* 24 26 23   GLUCOSE  --    < > 91 156* 117* 84  BUN  --    < > 17 16 18 21   CREATININE  --    < >  0.78 0.74 0.79 0.75  CALCIUM  --    < > 8.8* 9.1 9.6 9.4  GFRNONAA  --    < > >60 >60 >60 >60  PROT 7.1  --  7.0  --   --   --   ALBUMIN 3.8  --  3.7  --   --   --    AST 24  --  18  --   --   --   ALT 23  --  14  --   --   --   ALKPHOS 56  --  57  --   --   --   BILITOT 0.6  --  0.4  --   --   --   BILIDIR <0.1  --   --   --   --   --   IBILI NOT CALCULATED  --   --   --   --   --    < > = values in this interval not displayed.     No results found.   ASSESSMENT & PLAN:   Symptomatic anemia # Severe Anemia- [Feb 2025- Hb 4.6] -Hb-symptomatic.  Likely due to iron  deficiency. Oral iron : started in FEB 2025 [? Abdominal cramps]- tolerating well.   # S/p IV iron  infusion/Venofer - proceed with venofer  today  #  Mild B12- deficiency [FEB 2025-218]- recommend OTC Sublingula B12.    #Etiology of iron  deficiency: S/p GI evaluation-  colonoscopy- on [facility] [Dr.Russow] on April 3rd, 2025-multiple duodenal/gastric erosions. HOLDING OFF batiatric surgery [facility]-sec to cannabis. S/p wegovy- currently off insurance.   # CHF- diastolic- Dr.Alluri [[facility]]; s/p  appt.   # DISPOSITION: # venofer  today # follow up 4 months-- MD; labs- cbc/bmp; iron  sudies; ferritin; b12 levels- possible venofer - Dr.B     All questions were answered. The patient knows to call the clinic with any problems, questions or concerns.    Cindy JONELLE Joe, MD 01/18/2024 3:14 PM

## 2024-01-18 NOTE — Assessment & Plan Note (Addendum)
#   Severe Anemia- [Feb 2025- Hb 4.6] -Hb-symptomatic.  Likely due to iron  deficiency. Oral iron : started in FEB 2025 [? Abdominal cramps]- tolerating well.   # S/p IV iron  infusion/Venofer - proceed with venofer  today  #  Mild B12- deficiency [FEB 2025-218]- recommend OTC Sublingula B12.    #Etiology of iron  deficiency: S/p GI evaluation-  colonoscopy- on [facility] [Dr.Russow] on April 3rd, 2025-multiple duodenal/gastric erosions. HOLDING OFF batiatric surgery [facility]-sec to cannabis. S/p wegovy- currently off insurance.   # CHF- diastolic- Dr.Alluri [[facility]]; s/p  appt.   # DISPOSITION: # venofer  today # follow up 4 months-- MD; labs- cbc/bmp; iron  sudies; ferritin; b12 levels- possible venofer - Dr.B

## 2024-01-18 NOTE — Progress Notes (Signed)
 Fatigue/weakness: NO Dyspena: NO Light headedness: YES-DIZZINESS Blood in stool: NO

## 2024-02-16 ENCOUNTER — Other Ambulatory Visit: Payer: Self-pay | Admitting: Pediatrics

## 2024-02-16 DIAGNOSIS — Z1231 Encounter for screening mammogram for malignant neoplasm of breast: Secondary | ICD-10-CM

## 2024-05-17 ENCOUNTER — Inpatient Hospital Stay: Admitting: Internal Medicine

## 2024-05-17 ENCOUNTER — Inpatient Hospital Stay
